# Patient Record
Sex: Female | Born: 1960 | Race: White | Hispanic: No | Marital: Married | Smoking: Never smoker
Health system: Southern US, Community
[De-identification: ages and names within clinical notes are randomized; demographics above are authoritative.]

## PROBLEM LIST (undated history)

## (undated) DIAGNOSIS — F419 Anxiety disorder, unspecified: Secondary | ICD-10-CM

## (undated) DIAGNOSIS — E669 Obesity, unspecified: Secondary | ICD-10-CM

## (undated) DIAGNOSIS — N390 Urinary tract infection, site not specified: Secondary | ICD-10-CM

## (undated) DIAGNOSIS — N950 Postmenopausal bleeding: Secondary | ICD-10-CM

## (undated) DIAGNOSIS — M25512 Pain in left shoulder: Secondary | ICD-10-CM

## (undated) DIAGNOSIS — R350 Frequency of micturition: Principal | ICD-10-CM

## (undated) DIAGNOSIS — E782 Mixed hyperlipidemia: Principal | ICD-10-CM

## (undated) DIAGNOSIS — Z79890 Hormone replacement therapy (postmenopausal): Secondary | ICD-10-CM

## (undated) DIAGNOSIS — Z87440 Personal history of urinary (tract) infections: Secondary | ICD-10-CM

## (undated) DIAGNOSIS — R319 Hematuria, unspecified: Secondary | ICD-10-CM

## (undated) DIAGNOSIS — R309 Painful micturition, unspecified: Secondary | ICD-10-CM

## (undated) HISTORY — DX: Urinary tract infection, site not specified: N39.0

## (undated) HISTORY — DX: Hormone replacement therapy: Z79.890

## (undated) HISTORY — DX: Frequency of micturition: R35.0

## (undated) HISTORY — PX: CHOLECYSTECTOMY: SHX55

## (undated) HISTORY — DX: Personal history of urinary (tract) infections: Z87.440

## (undated) HISTORY — DX: Obesity, unspecified: E66.9

## (undated) HISTORY — DX: Postmenopausal bleeding: N95.0

## (undated) HISTORY — DX: Hematuria, unspecified: R31.9

## (undated) HISTORY — DX: Mixed hyperlipidemia: E78.2

## (undated) HISTORY — PX: LAPAROSCOPIC CHOLECYSTECTOMY: SUR755

## (undated) HISTORY — DX: Painful micturition, unspecified: R30.9

## (undated) HISTORY — DX: Anxiety disorder, unspecified: F41.9

---

## 2000-06-28 ENCOUNTER — Other Ambulatory Visit: Admission: RE | Admit: 2000-06-28 | Discharge: 2000-06-28 | Payer: Self-pay | Admitting: Dermatology

## 2000-07-10 ENCOUNTER — Encounter: Payer: Self-pay | Admitting: Obstetrics and Gynecology

## 2000-07-10 ENCOUNTER — Ambulatory Visit (HOSPITAL_COMMUNITY): Admission: RE | Admit: 2000-07-10 | Discharge: 2000-07-10 | Payer: Self-pay | Admitting: Obstetrics and Gynecology

## 2001-04-30 ENCOUNTER — Other Ambulatory Visit: Admission: RE | Admit: 2001-04-30 | Discharge: 2001-04-30 | Payer: Self-pay | Admitting: Obstetrics and Gynecology

## 2001-07-31 ENCOUNTER — Encounter: Payer: Self-pay | Admitting: Obstetrics and Gynecology

## 2001-07-31 ENCOUNTER — Ambulatory Visit (HOSPITAL_COMMUNITY): Admission: RE | Admit: 2001-07-31 | Discharge: 2001-07-31 | Payer: Self-pay | Admitting: Obstetrics and Gynecology

## 2003-05-11 ENCOUNTER — Other Ambulatory Visit: Admission: RE | Admit: 2003-05-11 | Discharge: 2003-05-11 | Payer: Self-pay | Admitting: Obstetrics and Gynecology

## 2003-12-25 ENCOUNTER — Ambulatory Visit (HOSPITAL_COMMUNITY): Admission: RE | Admit: 2003-12-25 | Discharge: 2003-12-25 | Payer: Self-pay | Admitting: Obstetrics and Gynecology

## 2004-12-26 ENCOUNTER — Ambulatory Visit (HOSPITAL_COMMUNITY): Admission: RE | Admit: 2004-12-26 | Discharge: 2004-12-26 | Payer: Self-pay | Admitting: Obstetrics and Gynecology

## 2006-01-11 ENCOUNTER — Ambulatory Visit (HOSPITAL_COMMUNITY): Admission: RE | Admit: 2006-01-11 | Discharge: 2006-01-11 | Payer: Self-pay | Admitting: Obstetrics and Gynecology

## 2006-12-06 ENCOUNTER — Other Ambulatory Visit: Admission: RE | Admit: 2006-12-06 | Discharge: 2006-12-06 | Payer: Self-pay | Admitting: Obstetrics and Gynecology

## 2007-01-21 ENCOUNTER — Other Ambulatory Visit: Admission: RE | Admit: 2007-01-21 | Discharge: 2007-01-21 | Payer: Self-pay | Admitting: Obstetrics and Gynecology

## 2007-05-20 ENCOUNTER — Ambulatory Visit (HOSPITAL_COMMUNITY): Admission: RE | Admit: 2007-05-20 | Discharge: 2007-05-20 | Payer: Self-pay | Admitting: Obstetrics and Gynecology

## 2008-01-15 ENCOUNTER — Other Ambulatory Visit: Admission: RE | Admit: 2008-01-15 | Discharge: 2008-01-15 | Payer: Self-pay | Admitting: Obstetrics and Gynecology

## 2008-06-01 ENCOUNTER — Ambulatory Visit (HOSPITAL_COMMUNITY): Admission: RE | Admit: 2008-06-01 | Discharge: 2008-06-01 | Payer: Self-pay | Admitting: Obstetrics and Gynecology

## 2010-02-27 ENCOUNTER — Encounter: Payer: Self-pay | Admitting: Emergency Medicine

## 2010-02-27 ENCOUNTER — Encounter: Payer: Self-pay | Admitting: Obstetrics and Gynecology

## 2010-04-08 ENCOUNTER — Other Ambulatory Visit (HOSPITAL_COMMUNITY)
Admission: RE | Admit: 2010-04-08 | Discharge: 2010-04-08 | Disposition: A | Discharge status: Home / Self Care | Payer: 59 | Source: Ambulatory Visit | Attending: Obstetrics and Gynecology | Admitting: Obstetrics and Gynecology

## 2010-04-08 DIAGNOSIS — Z01419 Encounter for gynecological examination (general) (routine) without abnormal findings: Secondary | ICD-10-CM | POA: Insufficient documentation

## 2010-04-26 ENCOUNTER — Other Ambulatory Visit: Payer: Self-pay | Admitting: Adult Health

## 2010-06-07 ENCOUNTER — Ambulatory Visit (HOSPITAL_COMMUNITY)
Admission: RE | Admit: 2010-06-07 | Discharge: 2010-06-07 | Disposition: A | Discharge status: Home / Self Care | Payer: 59 | Source: Ambulatory Visit | Attending: Obstetrics & Gynecology | Admitting: Obstetrics & Gynecology

## 2010-06-07 ENCOUNTER — Ambulatory Visit (HOSPITAL_COMMUNITY): Payer: 59

## 2010-06-07 ENCOUNTER — Other Ambulatory Visit: Payer: Self-pay | Admitting: Obstetrics & Gynecology

## 2010-06-07 DIAGNOSIS — R102 Pelvic and perineal pain: Secondary | ICD-10-CM

## 2010-06-07 DIAGNOSIS — R109 Unspecified abdominal pain: Secondary | ICD-10-CM | POA: Insufficient documentation

## 2010-06-07 DIAGNOSIS — N390 Urinary tract infection, site not specified: Secondary | ICD-10-CM

## 2010-12-08 ENCOUNTER — Other Ambulatory Visit: Payer: Self-pay | Admitting: Adult Health

## 2010-12-08 DIAGNOSIS — Z139 Encounter for screening, unspecified: Secondary | ICD-10-CM

## 2010-12-19 ENCOUNTER — Ambulatory Visit (HOSPITAL_COMMUNITY)
Admission: RE | Admit: 2010-12-19 | Discharge: 2010-12-19 | Disposition: A | Discharge status: Home / Self Care | Payer: 59 | Source: Ambulatory Visit | Attending: Adult Health | Admitting: Adult Health

## 2010-12-19 DIAGNOSIS — Z1231 Encounter for screening mammogram for malignant neoplasm of breast: Secondary | ICD-10-CM | POA: Insufficient documentation

## 2010-12-19 DIAGNOSIS — Z139 Encounter for screening, unspecified: Secondary | ICD-10-CM

## 2010-12-30 ENCOUNTER — Other Ambulatory Visit: Payer: Self-pay | Admitting: Adult Health

## 2010-12-30 DIAGNOSIS — R928 Other abnormal and inconclusive findings on diagnostic imaging of breast: Secondary | ICD-10-CM

## 2011-01-02 ENCOUNTER — Other Ambulatory Visit: Payer: Self-pay | Admitting: Adult Health

## 2011-01-02 DIAGNOSIS — R928 Other abnormal and inconclusive findings on diagnostic imaging of breast: Secondary | ICD-10-CM

## 2011-01-12 ENCOUNTER — Ambulatory Visit
Admission: RE | Admit: 2011-01-12 | Discharge: 2011-01-12 | Disposition: A | Discharge status: Home / Self Care | Payer: 59 | Source: Ambulatory Visit | Attending: Adult Health | Admitting: Adult Health

## 2011-01-12 DIAGNOSIS — R928 Other abnormal and inconclusive findings on diagnostic imaging of breast: Secondary | ICD-10-CM

## 2011-01-18 ENCOUNTER — Encounter (HOSPITAL_COMMUNITY): Payer: 59

## 2011-10-17 ENCOUNTER — Other Ambulatory Visit: Payer: Self-pay | Admitting: Adult Health

## 2011-10-17 ENCOUNTER — Other Ambulatory Visit (HOSPITAL_COMMUNITY)
Admission: RE | Admit: 2011-10-17 | Discharge: 2011-10-17 | Disposition: A | Discharge status: Home / Self Care | Payer: 59 | Source: Ambulatory Visit | Attending: Obstetrics and Gynecology | Admitting: Obstetrics and Gynecology

## 2011-10-17 DIAGNOSIS — Z01419 Encounter for gynecological examination (general) (routine) without abnormal findings: Secondary | ICD-10-CM | POA: Insufficient documentation

## 2011-10-17 DIAGNOSIS — Z1151 Encounter for screening for human papillomavirus (HPV): Secondary | ICD-10-CM | POA: Insufficient documentation

## 2012-01-09 ENCOUNTER — Other Ambulatory Visit: Payer: Self-pay | Admitting: Adult Health

## 2012-01-09 DIAGNOSIS — Z09 Encounter for follow-up examination after completed treatment for conditions other than malignant neoplasm: Secondary | ICD-10-CM

## 2012-01-15 ENCOUNTER — Ambulatory Visit (HOSPITAL_COMMUNITY)
Admission: RE | Admit: 2012-01-15 | Discharge: 2012-01-15 | Disposition: A | Discharge status: Home / Self Care | Payer: 59 | Source: Ambulatory Visit | Attending: Adult Health | Admitting: Adult Health

## 2012-01-15 DIAGNOSIS — Z1231 Encounter for screening mammogram for malignant neoplasm of breast: Secondary | ICD-10-CM | POA: Insufficient documentation

## 2012-01-15 DIAGNOSIS — Z09 Encounter for follow-up examination after completed treatment for conditions other than malignant neoplasm: Secondary | ICD-10-CM

## 2012-07-09 ENCOUNTER — Other Ambulatory Visit: Payer: Self-pay | Admitting: Adult Health

## 2012-07-09 ENCOUNTER — Telehealth: Payer: Self-pay | Admitting: Adult Health

## 2012-07-09 MED ORDER — SULFAMETHOXAZOLE-TRIMETHOPRIM 800-160 MG PO TABS: 1.0000 | ORAL_TABLET | Freq: Two times a day (BID) | ORAL | Status: DC

## 2012-07-09 NOTE — Telephone Encounter (Signed)
Debbie called with frequency and urgency and wanted something called in for UTI, will call [facility] to [employer].

## 2012-08-21 ENCOUNTER — Ambulatory Visit: Payer: 59 | Admitting: Orthopedic Surgery

## 2012-08-26 ENCOUNTER — Ambulatory Visit (HOSPITAL_COMMUNITY)
Admission: RE | Admit: 2012-08-26 | Discharge: 2012-08-26 | Disposition: A | Discharge status: Home / Self Care | Payer: 59 | Source: Ambulatory Visit | Attending: Orthopedic Surgery | Admitting: Orthopedic Surgery

## 2012-08-26 ENCOUNTER — Other Ambulatory Visit: Payer: Self-pay | Admitting: Orthopedic Surgery

## 2012-08-26 DIAGNOSIS — M25512 Pain in left shoulder: Secondary | ICD-10-CM

## 2012-08-26 DIAGNOSIS — M25519 Pain in unspecified shoulder: Secondary | ICD-10-CM | POA: Insufficient documentation

## 2012-08-27 ENCOUNTER — Ambulatory Visit (INDEPENDENT_AMBULATORY_CARE_PROVIDER_SITE_OTHER): Payer: 59 | Admitting: Orthopedic Surgery

## 2012-08-27 ENCOUNTER — Encounter: Payer: Self-pay | Admitting: Orthopedic Surgery

## 2012-08-27 VITALS — BP 142/96 | Ht 66.5 in | Wt 247.0 lb

## 2012-08-27 DIAGNOSIS — M75102 Unspecified rotator cuff tear or rupture of left shoulder, not specified as traumatic: Secondary | ICD-10-CM

## 2012-08-27 DIAGNOSIS — M67919 Unspecified disorder of synovium and tendon, unspecified shoulder: Secondary | ICD-10-CM

## 2012-08-27 MED ORDER — TRAMADOL-ACETAMINOPHEN 37.5-325 MG PO TABS: 1.0000 | ORAL_TABLET | ORAL | Status: DC | PRN

## 2012-08-27 NOTE — Progress Notes (Signed)
  Subjective:    Patient ID: Amanda Higgins, female    DOB: [birth year], 52 y.o.   MRN: [id number]  HPI Comments: I cant sleep and i can't reach behind me   Shoulder Pain  The pain is present in the left shoulder. This is a new problem. The current episode started more than 1 month ago (3-6 mos). The problem has been unchanged. The quality of the pain is described as pounding and aching. Associated symptoms include tingling. Associated symptoms comments: Catching .      Review of Systems  Musculoskeletal:       Joint pain and swelling   Neurological: Positive for tingling.  All other systems reviewed and are negative.       Objective:   Physical Exam BP 142/96  Ht 5' 6.5" (1.689 m)  Wt 247 lb (112.038 kg)  BMI 39.27 kg/m2 General appearance is normal, the patient is alert and oriented x3 with normal mood and affect. Gait normal   Right shoulder normal ROM, stability and strength    Left shoulder:  Posterior joint line tender periacromial tenderness, Loss of IR and ER (hip and 30 respectively) FE pain S spinatus 5/5  abd ER normal  Impingement signs:  Painful forward elevation at 120 degrees  Painful arc of motion passively 90-150 degrees  Neer sign +   Neurovascular exam normal and neck non tender   [facility] X rays were normal  (I rev report and film )      Assessment & Plan:  Left shoulder RCS   Subacromial Shoulder Injection Procedure Note  Pre-operative Diagnosis: left RC Syndrome  Post-operative Diagnosis: same  Indications: pain   Anesthesia: ethyl chloride   Procedure Details   Verbal consent was obtained for the procedure. The shoulder was prepped withalcohol and the skin was anesthetized. A 20 gauge needle was advanced into the subacromial space through posterior approach without difficulty  The space was then injected with 3 ml 1% lidocaine and 1 ml of depomedrol. The injection site was cleansed with isopropyl alcohol and a dressing was  applied.  Complications:  None; patient tolerated the procedure well.  Start PT  [employer]

## 2012-08-27 NOTE — Patient Instructions (Signed)
You have received a steroid shot. 15% of patients experience increased pain at the injection site with in the next 24 hours. This is best treated with ice and tylenol extra strength 2 tabs every 8 hours. If you are still having pain please call the office.   Impingement Syndrome, Rotator Cuff, Bursitis with Rehab Impingement syndrome is a condition that involves inflammation of the tendons of the rotator cuff and the subacromial bursa, that causes pain in the shoulder. The rotator cuff consists of four tendons and muscles that control much of the shoulder and upper arm function. The subacromial bursa is a fluid filled sac that helps reduce friction between the rotator cuff and one of the bones of the shoulder (acromion). Impingement syndrome is usually an overuse injury that causes swelling of the bursa (bursitis), swelling of the tendon (tendonitis), and/or a tear of the tendon (strain). Strains are classified into three categories. Grade 1 strains cause pain, but the tendon is not lengthened. Grade 2 strains include a lengthened ligament, due to the ligament being stretched or partially ruptured. With grade 2 strains there is still function, although the function may be decreased. Grade 3 strains include a complete tear of the tendon or muscle, and function is usually impaired. SYMPTOMS    Pain around the shoulder, often at the outer portion of the upper arm.   Pain that gets worse with shoulder function, especially when reaching overhead or lifting.   Sometimes, aching when not using the arm.   Pain that wakes you up at night.   Sometimes, tenderness, swelling, warmth, or redness over the affected area.   Loss of strength.   Limited motion of the shoulder, especially reaching behind the back (to the back pocket or to unhook bra) or across your body.   Crackling sound (crepitation) when moving the arm.   Biceps tendon pain and inflammation (in the front of the shoulder). Worse when bending  the elbow or lifting.  CAUSES   Impingement syndrome is often an overuse injury, in which chronic (repetitive) motions cause the tendons or bursa to become inflamed. A strain occurs when a force is paced on the tendon or muscle that is greater than it can withstand. Common mechanisms of injury include: Stress from sudden increase in duration, frequency, or intensity of training.  Direct hit (trauma) to the shoulder.   Aging, erosion of the tendon with normal use.   Bony bump on shoulder (acromial spur).  RISK INCREASES WITH:  Contact sports (football, wrestling, boxing).   Throwing sports (baseball, tennis, volleyball).   Weightlifting and bodybuilding.   Heavy labor.   Previous injury to the rotator cuff, including impingement.   Poor shoulder strength and flexibility.   Failure to warm up properly before activity.   Inadequate protective equipment.   Old age.   Bony bump on shoulder (acromial spur).  PREVENTION    Warm up and stretch properly before activity.   Allow for adequate recovery between workouts.   Maintain physical fitness:   Strength, flexibility, and endurance.   Cardiovascular fitness.   Learn and use proper exercise technique.  PROGNOSIS   If treated properly, impingement syndrome usually goes away within 6 weeks. Sometimes surgery is required.   RELATED COMPLICATIONS    Longer healing time if not properly treated, or if not given enough time to heal.   Recurring symptoms, that result in a chronic condition.   Shoulder stiffness, frozen shoulder, or loss of motion.   Rotator cuff tendon tear.     Recurring symptoms, especially if activity is resumed too soon, with overuse, with a direct blow, or when using poor technique.  TREATMENT   Treatment first involves the use of ice and medicine, to reduce pain and inflammation. The use of strengthening and stretching exercises may help reduce pain with activity. These exercises may be performed at home  or with a therapist. If non-surgical treatment is unsuccessful after more than 6 months, surgery may be advised. After surgery and rehabilitation, activity is usually possible in 3 months.   MEDICATION  If pain medicine is needed, nonsteroidal anti-inflammatory medicines (aspirin and ibuprofen), or other minor pain relievers (acetaminophen), are often advised.   Do not take pain medicine for 7 days before surgery.   Prescription pain relievers may be given, if your caregiver thinks they are needed. Use only as directed and only as much as you need.   Corticosteroid injections may be given by your caregiver. These injections should be reserved for the most serious cases, because they may only be given a certain number of times.  HEAT AND COLD  Cold treatment (icing) should be applied for 10 to 15 minutes every 2 to 3 hours for inflammation and pain, and immediately after activity that aggravates your symptoms. Use ice packs or an ice massage.   Heat treatment may be used before performing stretching and strengthening activities prescribed by your caregiver, physical therapist, or athletic trainer. Use a heat pack or a warm water soak.  SEEK MEDICAL CARE IF:    Symptoms get worse or do not improve in 4 to 6 weeks, despite treatment.   New, unexplained symptoms develop. (Drugs used in treatment may produce side effects.)   

## 2012-08-30 ENCOUNTER — Ambulatory Visit (HOSPITAL_COMMUNITY)
Admission: RE | Admit: 2012-08-30 | Discharge: 2012-08-30 | Disposition: A | Discharge status: Home / Self Care | Payer: 59 | Source: Ambulatory Visit | Attending: Orthopedic Surgery | Admitting: Orthopedic Surgery

## 2012-08-30 DIAGNOSIS — M25619 Stiffness of unspecified shoulder, not elsewhere classified: Secondary | ICD-10-CM | POA: Insufficient documentation

## 2012-08-30 DIAGNOSIS — M6289 Other specified disorders of muscle: Secondary | ICD-10-CM | POA: Insufficient documentation

## 2012-08-30 DIAGNOSIS — IMO0001 Reserved for inherently not codable concepts without codable children: Secondary | ICD-10-CM | POA: Insufficient documentation

## 2012-08-30 DIAGNOSIS — M75102 Unspecified rotator cuff tear or rupture of left shoulder, not specified as traumatic: Secondary | ICD-10-CM

## 2012-08-30 DIAGNOSIS — M6281 Muscle weakness (generalized): Secondary | ICD-10-CM | POA: Insufficient documentation

## 2012-08-30 DIAGNOSIS — M25519 Pain in unspecified shoulder: Secondary | ICD-10-CM | POA: Insufficient documentation

## 2012-08-30 NOTE — Evaluation (Addendum)
Occupational Therapy Evaluation  Patient Details  Name: Amanda Higgins MRN: [id number] Date of Birth: [birth year]  Today's Date: 08/30/2012 Time: 1120-1205 OT Time Calculation (min): 45 min OT Evaluation 1120-1135 15' Manual Therapy 5784-6962 20' Heat 9528-4132 10' Visit#: 1 of 12  Re-eval: 09/27/12  Assessment Diagnosis: Rotator Cuff Syndrome Left Shoulder Next MD Visit: August - with Dr. Romeo Apple Prior Therapy: n/a  Past Medical History:  Past Medical History  Diagnosis Date  . GERD (gastroesophageal reflux disease)    Past Surgical History:  Past Surgical History  Procedure Laterality Date  . Laparoscopic cholecystectomy      Subjective S:  My shoulder has been hurting for months, I finally went to the MD this week.  Pertinent History: Amanda Higgins has been experiencing pain in her left shoulder for several months, with gradual decline in mobility and increase in pain.  She consulted with Dr. Romeo Apple on 08/27/12, received a cortisone injection, and has been referred to occupational therapy for evaluation and treatment.  Special Tests: DASH scored 52 Patient Stated Goals: "I want to get the movement back and be pain free." Pain Assessment Currently in Pain?: Yes Pain Score: 4  Pain Location: Shoulder Pain Orientation: Left Pain Type: Acute pain  Precautions/Restrictions  Precautions Precautions: None Restrictions Weight Bearing Restrictions: No  Balance Screening Balance Screen Has the patient fallen in the past 6 months: No Has the patient had a decrease in activity level because of a fear of falling? : No Is the patient reluctant to leave their home because of a fear of falling? : No  Prior Functioning  Prior Function Driving: Yes Vocation: Full time employment Vocation Requirements: RN - administration Comments: enjoys traveling and reading  Assessment ADL/Vision/Perception ADL ADL Comments: Reaching behind her back or out to her left and back is  quite painful.  She has increased pain in her left shoulder with prolonged computer use.  Dominant Hand: Right  Sensation/Coordination/Edema Sensation Light Touch: Appears Intact Coordination Gross Motor Movements are Fluid and Coordinated: Yes Fine Motor Movements are Fluid and Coordinated: Yes  Additional Assessments LUE AROM (degrees) LUE Overall AROM Comments: assessed in seated, ER/IR with shoulder abducted Left Shoulder Flexion: 135 Degrees Left Shoulder ABduction: 127 Degrees Left Shoulder Internal Rotation: 33 Degrees Left Shoulder External Rotation: 95 Degrees LUE PROM (degrees) LUE Overall PROM Comments: assessed in supine, WFL` LUE Strength LUE Overall Strength Comments: assessed in seated Left Shoulder Flexion: 4/5 Left Shoulder ABduction: 4/5 Left Shoulder Internal Rotation: 4/5 Left Shoulder External Rotation: 4/5 Palpation Palpation: mod-max fascial restrictions in her upper arm, and moderate fascial restrictions in her left scapular region.      Exercise/Treatments    Modalities Modalities: Moist Heat Manual Therapy Manual Therapy: Myofascial release Myofascial Release: MFR and manual stretching to upper arm, shoulder, scapular, trapezius region of left shoulder to  decrease pain and fascial restrictions and increase pain free mobility.  Moist Heat Therapy Number Minutes Moist Heat: 10 Minutes Moist Heat Location: Shoulder  Occupational Therapy Assessment and Plan OT Assessment and Plan Clinical Impression Statement: A:  Patient presents with increased pain and fasical restrictions and decreased mobility and strength in her left shoulder region, which is causing decreased participation with B/IADLs, work, and leisure activities.  Pt will benefit from skilled therapeutic intervention in order to improve on the following deficits: Decreased strength;Decreased range of motion;Increased fascial restricitons;Increased muscle spasms;Pain Rehab Potential:  Excellent OT Frequency: Min 2X/week OT Duration: 6 weeks OT Treatment/Interventions: Self-care/ADL training;Therapeutic exercise;Manual therapy;Modalities;Patient/family education OT  Plan: P:  Skilled OT intervention to increase AROM and strength and decrease pain and fascial restrictions in left shoulder region in order to return to prior level of independence with all B/IADLs, work, and leisure activities.  Treatment Plan:  MFR and manual stretching to left shoulder region, AAROM progressing to AROM as tolerated.  Scapular stability exercises.    Goals Short Term Goals Time to Complete Short Term Goals: 3 weeks Short Term Goal 1: Patient will be educated on a HEP. Short Term Goal 2: Patient will increase AROM to [facility] in her left shoulder to improve ability to reach behind back to fasten her bra. Short Term Goal 3: Patient with increase left shoulder strength to 4+/5 for increased ability to lift binders at work.  Short Term Goal 4: Patient will decrease pain in her left shoulder to 3/10 when sitting at her desk. Short Term Goal 5: Patient will decrease fascial restrictions to minimal in her left shoulder region.  Long Term Goals Time to Complete Long Term Goals: 6 weeks Long Term Goal 1: Patient will return to prior level of independence with all B/IADLs, work, and leisure actiivties.  Long Term Goal 2: Patient will increase AROM to WNL in her left shoulder to improve ability to reach behind back to fasten her bra. Long Term Goal 3: Patient with increase left shoulder strength to 5/5 for increased ability to lift bags of groceries. Long Term Goal 4: Patient will decrease pain in her left shoulder to 1/10 when sitting at her desk. Long Term Goal 5: Patient will decrease fascial restrictions to  trace her left shoulder region.   Problem List Patient Active Problem List   Diagnosis Date Noted  . Pain in joint, shoulder region 08/30/2012  . Muscle tightness 08/30/2012  . Rotator cuff syndrome  of left shoulder 08/27/2012    End of Session Activity Tolerance: Patient tolerated treatment well General Behavior During Therapy: [facility] for tasks assessed/performed OT Plan of Care OT Home Exercise Plan: educated patient on shoulder stretches, use of heat for pain relief, and red tband for scapular stability.  Consulted and Agree with Plan of Care: Patient  GO    Shirlean Mylar, OTR/L  08/30/2012, 2:18 PM  Physician Documentation Your signature is required to indicate approval of the treatment plan as stated above.  Please sign and either send electronically or make a copy of this report for your files and return this physician signed original.  Please mark one 1.__approve of plan  2. ___approve of plan with the following conditions.   ______________________________                                                          _____________________ Physician Signature  Date  

## 2012-09-09 ENCOUNTER — Ambulatory Visit (HOSPITAL_COMMUNITY)
Admission: RE | Admit: 2012-09-09 | Discharge: 2012-09-09 | Disposition: A | Discharge status: Home / Self Care | Payer: 59 | Source: Ambulatory Visit | Attending: Orthopedic Surgery | Admitting: Orthopedic Surgery

## 2012-09-09 DIAGNOSIS — IMO0001 Reserved for inherently not codable concepts without codable children: Secondary | ICD-10-CM | POA: Insufficient documentation

## 2012-09-09 DIAGNOSIS — M6281 Muscle weakness (generalized): Secondary | ICD-10-CM | POA: Insufficient documentation

## 2012-09-09 DIAGNOSIS — M6289 Other specified disorders of muscle: Secondary | ICD-10-CM

## 2012-09-09 DIAGNOSIS — M25512 Pain in left shoulder: Secondary | ICD-10-CM

## 2012-09-09 DIAGNOSIS — M25619 Stiffness of unspecified shoulder, not elsewhere classified: Secondary | ICD-10-CM | POA: Insufficient documentation

## 2012-09-09 DIAGNOSIS — M25519 Pain in unspecified shoulder: Secondary | ICD-10-CM | POA: Insufficient documentation

## 2012-09-09 NOTE — Progress Notes (Signed)
Occupational Therapy Treatment Patient Details  Name: Amanda Higgins MRN: [id number] Date of Birth: January 27, 1961  Today's Date: 09/09/2012 Time: 9147-8295 OT Time Calculation (min): 36 min MFR 6213-086578' Therex 4696-2952 24'  Visit#: 2 of 12  Re-eval: 09/27/12    Authorization: n/a  Authorization Time Period:    Authorization Visit#:   of    Subjective Symptoms/Limitations Symptoms: S: I'm having a hard time doing the band exercise where I keep my arm close to me. I can't do it very well.  Pain Assessment Currently in Pain?: Yes Pain Score: 4  Pain Location: Shoulder Pain Orientation: Left Pain Type: Acute pain  Precautions/Restrictions  Precautions Precautions: None  Exercise/Treatments Supine Protraction: PROM;10 reps Horizontal ABduction: PROM;10 reps External Rotation: PROM;10 reps Internal Rotation: PROM;10 reps Flexion: PROM;10 reps ABduction: PROM;10 reps Seated Elevation: AROM;10 reps Extension: AROM;10 reps Row: AROM;10 reps Therapy Ball Flexion: 15 reps ABduction: 15 reps ROM / Strengthening / Isometric Strengthening Thumb Tacks: 1' Prot/Ret//Elev/Dep: 1'       Manual Therapy Manual Therapy: Myofascial release Myofascial Release: MFR and manual stretching to upper arm, shoulder, scapular, trapezius region of left shoulder to decrease pain and fascial restrictions and increase pain free mobility.   Occupational Therapy Assessment and Plan OT Assessment and Plan Clinical Impression Statement: A: Reviewed HEP. Increased pain during manual stretching. Patient unable to tolerate full PROM supine. Deferred introducing AAROM supine this date due to pain. Will attempt next visit.  Pt will benefit from skilled therapeutic intervention in order to improve on the following deficits: Decreased strength;Decreased range of motion;Increased fascial restricitons;Increased muscle spasms;Pain Rehab Potential: Excellent OT Frequency: Min 2X/week OT Duration: 6  weeks OT Treatment/Interventions: Self-care/ADL training;Therapeutic exercise;Manual therapy;Modalities;Patient/family education OT Plan: P: Attempt AAROM supine.    Goals Short Term Goals Time to Complete Short Term Goals: 3 weeks Short Term Goal 1: Patient will be educated on a HEP. Short Term Goal 1 Progress: Progressing toward goal Short Term Goal 2: Patient will increase AROM to [facility] in her left shoulder to improve ability to reach behind back to fasten her bra. Short Term Goal 2 Progress: Progressing toward goal Short Term Goal 3: Patient with increase left shoulder strength to 4+/5 for increased ability to lift binders at work.  Short Term Goal 3 Progress: Progressing toward goal Short Term Goal 4: Patient will decrease pain in her left shoulder to 3/10 when sitting at her desk. Short Term Goal 4 Progress: Progressing toward goal Short Term Goal 5: Patient will decrease fascial restrictions to minimal in her left shoulder region.  Short Term Goal 5 Progress: Progressing toward goal Long Term Goals Time to Complete Long Term Goals: 6 weeks Long Term Goal 1: Patient will return to prior level of independence with all B/IADLs, work, and leisure actiivties.  Long Term Goal 1 Progress: Progressing toward goal Long Term Goal 2: Patient will increase AROM to WNL in her left shoulder to improve ability to reach behind back to fasten her bra. Long Term Goal 2 Progress: Progressing toward goal Long Term Goal 3: Patient with increase left shoulder strength to 5/5 for increased ability to lift bags of groceries. Long Term Goal 3 Progress: Progressing toward goal Long Term Goal 4: Patient will decrease pain in her left shoulder to 1/10 when sitting at her desk. Long Term Goal 4 Progress: Progressing toward goal Long Term Goal 5: Patient will decrease fascial restrictions to  trace her left shoulder region.  Long Term Goal 5 Progress: Progressing toward goal  Problem List Patient Active  Problem List   Diagnosis Date Noted  . Pain in joint, shoulder region 08/30/2012  . Muscle tightness 08/30/2012  . Rotator cuff syndrome of left shoulder 08/27/2012    End of Session Activity Tolerance: Patient tolerated treatment well General Behavior During Therapy: [facility] for tasks assessed/performed   Limmie Patricia, OTR/L,CBIS   09/09/2012, 11:54 AM

## 2012-09-24 ENCOUNTER — Inpatient Hospital Stay (HOSPITAL_COMMUNITY): Admission: RE | Admit: 2012-09-24 | Payer: 59 | Source: Ambulatory Visit

## 2012-10-01 ENCOUNTER — Ambulatory Visit (HOSPITAL_COMMUNITY)
Admission: RE | Admit: 2012-10-01 | Discharge: 2012-10-01 | Disposition: A | Discharge status: Home / Self Care | Payer: 59 | Source: Ambulatory Visit | Attending: Adult Health | Admitting: Adult Health

## 2012-10-01 NOTE — Progress Notes (Signed)
Occupational Therapy Treatment Patient Details  Name: Amanda Higgins MRN: [id number] Date of Birth: [birth year]  Today's Date: 10/01/2012 Time: 1350-1430 OT Time Calculation (min): 40 min MFR 1350-1409 19' Therex 4098-1191 21  Visit#: 3 of 12  Re-eval: 09/27/12    Authorization: n/a  Authorization Time Period:    Authorization Visit#:   of    Subjective Symptoms/Limitations Symptoms: S: I've been trying to stretch my arm out a lot at home.   Precautions/Restrictions  Precautions Precautions: None  Exercise/Treatments Supine Protraction: PROM;AAROM;10 reps Horizontal ABduction: PROM;AAROM;10 reps External Rotation: PROM;AAROM;10 reps Internal Rotation: PROM;AAROM;10 reps Flexion: PROM;AAROM;10 reps ABduction: PROM;AAROM;10 reps Seated Elevation: AROM;15 reps Extension: AROM;15 reps Row: PROM;15 reps ROM / Strengthening / Isometric Strengthening Proximal Shoulder Strengthening, Supine: 10X        Manual Therapy Manual Therapy: Myofascial release Myofascial Release: MFR and manual stretching to upper arm, shoulder, scapular, trapezius region of left shoulder to decrease pain and fascial restrictions and increase pain free mobility.  Occupational Therapy Assessment and Plan OT Assessment and Plan Clinical Impression Statement: A: Added AAROM supine. Patient tolerated well with no pain.  OT Plan: P: Add AAROM seated.   Goals Short Term Goals Time to Complete Short Term Goals: 3 weeks Short Term Goal 1: Patient will be educated on a HEP. Short Term Goal 1 Progress: Progressing toward goal Short Term Goal 2: Patient will increase AROM to [facility] in her left shoulder to improve ability to reach behind back to fasten her bra. Short Term Goal 2 Progress: Progressing toward goal Short Term Goal 3: Patient with increase left shoulder strength to 4+/5 for increased ability to lift binders at work.  Short Term Goal 3 Progress: Progressing toward goal Short Term Goal 4:  Patient will decrease pain in her left shoulder to 3/10 when sitting at her desk. Short Term Goal 4 Progress: Progressing toward goal Short Term Goal 5: Patient will decrease fascial restrictions to minimal in her left shoulder region.  Short Term Goal 5 Progress: Progressing toward goal Long Term Goals Time to Complete Long Term Goals: 6 weeks Long Term Goal 1: Patient will return to prior level of independence with all B/IADLs, work, and leisure actiivties.  Long Term Goal 1 Progress: Progressing toward goal Long Term Goal 2: Patient will increase AROM to WNL in her left shoulder to improve ability to reach behind back to fasten her bra. Long Term Goal 2 Progress: Progressing toward goal Long Term Goal 3: Patient with increase left shoulder strength to 5/5 for increased ability to lift bags of groceries. Long Term Goal 3 Progress: Progressing toward goal Long Term Goal 4: Patient will decrease pain in her left shoulder to 1/10 when sitting at her desk. Long Term Goal 4 Progress: Progressing toward goal Long Term Goal 5: Patient will decrease fascial restrictions to  trace her left shoulder region.  Long Term Goal 5 Progress: Progressing toward goal  Problem List Patient Active Problem List   Diagnosis Date Noted  . Pain in joint, shoulder region 08/30/2012  . Muscle tightness 08/30/2012  . Rotator cuff syndrome of left shoulder 08/27/2012    End of Session Activity Tolerance: Patient tolerated treatment well General Behavior During Therapy: [facility] for tasks assessed/performed   Limmie Patricia, OTR/L,CBIS   10/01/2012, 3:54 PM

## 2012-10-02 ENCOUNTER — Inpatient Hospital Stay (HOSPITAL_COMMUNITY): Admission: RE | Admit: 2012-10-02 | Payer: 59 | Source: Ambulatory Visit

## 2012-10-08 ENCOUNTER — Telehealth: Payer: Self-pay

## 2012-10-08 NOTE — Telephone Encounter (Signed)
Per RMR, call and triage pt for first screening colonoscopy. Called, [facility] for a return call.

## 2012-10-09 NOTE — Telephone Encounter (Signed)
Pt returned call at end of day yesterday and said she forgot to mention to RMR that she does have reflux although she takes Prilosec daily, and she thinks she might need EGD at the same time. I sent Dr. Jena Gauss a staff message and he said ok to triage and add EGD.

## 2012-10-09 NOTE — Telephone Encounter (Signed)
LMOM earlier today for pt to return call that Dr Jena Gauss said ok to triage and schedule both.

## 2012-10-10 ENCOUNTER — Other Ambulatory Visit: Payer: Self-pay

## 2012-10-10 DIAGNOSIS — K219 Gastro-esophageal reflux disease without esophagitis: Secondary | ICD-10-CM

## 2012-10-10 DIAGNOSIS — Z1211 Encounter for screening for malignant neoplasm of colon: Secondary | ICD-10-CM

## 2012-10-10 MED ORDER — PEG-KCL-NACL-NASULF-NA ASC-C 100 G PO SOLR: 1.0000 | ORAL | Status: DC

## 2012-10-10 NOTE — Telephone Encounter (Signed)
Gastroenterology Pre-Procedure Review  Request Date: 10/09/2012 Requesting Physician: Dr. Jena Gauss  For Screening colonoscopy and EGD for reflux  PATIENT REVIEW QUESTIONS: The patient responded to the following health history questions as indicated:    1. Diabetes Melitis: no 2. Joint replacements in the past 12 months: no 3. Major health problems in the past 3 months: no 4. Has an artificial valve or MVP: no 5. Has a defibrillator: no 6. Has been advised in past to take antibiotics in advance of a procedure like teeth cleaning: no    MEDICATIONS & ALLERGIES:    Patient reports the following regarding taking any blood thinners:   Plavix? no Aspirin? no Coumadin? no  Patient confirms/reports the following medications:  Current Outpatient Prescriptions  Medication Sig Dispense Refill  . ibuprofen (ADVIL,MOTRIN) 200 MG tablet Take 200 mg by mouth every 6 (six) hours as needed for pain. As needed      . NON FORMULARY Vitamin C      . NON FORMULARY Combi patch    As directed      . omeprazole (PRILOSEC) 20 MG capsule Take 20 mg by mouth daily.      . traMADol-acetaminophen (ULTRACET) 37.5-325 MG per tablet Take 1-2 tablets by mouth every 4 (four) hours as needed for pain.  60 tablet  2   No current facility-administered medications for this visit.    Patient confirms/reports the following allergies:  No Known Allergies  No orders of the defined types were placed in this encounter.    AUTHORIZATION INFORMATION Primary Insurance:   ID #:   Group #:  Pre-Cert / Auth required:  Pre-Cert / Auth #:   Secondary Insurance:   ID #:   Group #:  Pre-Cert / Auth required:  Pre-Cert / Auth #:   SCHEDULE INFORMATION: Procedure has been scheduled as follows:  Date: 10/18/2012    Time: 9:45 AM Location: [facility] Short Stay  This Gastroenterology Pre-Precedure Review Form is being routed to the following provider(s): R. Roetta Sessions, MD

## 2012-10-10 NOTE — Telephone Encounter (Signed)
Okay to schedule for EGD and colonoscopy

## 2012-10-10 NOTE — Telephone Encounter (Signed)
Rx sent to the pharmacy and instructions mailed to pt.  

## 2012-10-17 ENCOUNTER — Encounter (HOSPITAL_COMMUNITY): Payer: Self-pay | Admitting: Pharmacy Technician

## 2012-10-18 ENCOUNTER — Encounter (HOSPITAL_COMMUNITY): Payer: Self-pay | Admitting: *Deleted

## 2012-10-18 ENCOUNTER — Ambulatory Visit (HOSPITAL_COMMUNITY)
Admission: RE | Admit: 2012-10-18 | Discharge: 2012-10-18 | Disposition: A | Discharge status: Home / Self Care | Payer: 59 | Source: Ambulatory Visit | Attending: Internal Medicine | Admitting: Internal Medicine

## 2012-10-18 ENCOUNTER — Encounter (HOSPITAL_COMMUNITY)
Admission: RE | Disposition: A | Discharge status: Home / Self Care | Payer: Self-pay | Source: Ambulatory Visit | Attending: Internal Medicine

## 2012-10-18 DIAGNOSIS — D131 Benign neoplasm of stomach: Secondary | ICD-10-CM

## 2012-10-18 DIAGNOSIS — K5730 Diverticulosis of large intestine without perforation or abscess without bleeding: Secondary | ICD-10-CM

## 2012-10-18 DIAGNOSIS — K210 Gastro-esophageal reflux disease with esophagitis: Secondary | ICD-10-CM

## 2012-10-18 DIAGNOSIS — K219 Gastro-esophageal reflux disease without esophagitis: Secondary | ICD-10-CM | POA: Insufficient documentation

## 2012-10-18 DIAGNOSIS — Z1211 Encounter for screening for malignant neoplasm of colon: Secondary | ICD-10-CM | POA: Insufficient documentation

## 2012-10-18 HISTORY — DX: Pain in left shoulder: M25.512

## 2012-10-18 HISTORY — PX: COLONOSCOPY WITH ESOPHAGOGASTRODUODENOSCOPY (EGD): SHX5779

## 2012-10-18 SURGERY — COLONOSCOPY WITH ESOPHAGOGASTRODUODENOSCOPY (EGD)
Anesthesia: Moderate Sedation

## 2012-10-18 MED ORDER — ONDANSETRON HCL 4 MG/2ML IJ SOLN
INTRAMUSCULAR | Status: DC | PRN
  Administered 2012-10-18: 4 mg via INTRAVENOUS

## 2012-10-18 MED ORDER — MEPERIDINE HCL 100 MG/ML IJ SOLN
INTRAMUSCULAR | Status: AC
  Filled 2012-10-18: qty 1

## 2012-10-18 MED ORDER — MIDAZOLAM HCL 5 MG/5ML IJ SOLN
INTRAMUSCULAR | Status: AC
  Filled 2012-10-18: qty 10

## 2012-10-18 MED ORDER — SODIUM CHLORIDE 0.9 % IV SOLN
INTRAVENOUS | Status: DC
  Administered 2012-10-18: 09:00:00 via INTRAVENOUS

## 2012-10-18 MED ORDER — MEPERIDINE HCL 100 MG/ML IJ SOLN
INTRAMUSCULAR | Status: DC | PRN
  Administered 2012-10-18: 50 mg via INTRAVENOUS
  Administered 2012-10-18 (×2): 25 mg via INTRAVENOUS

## 2012-10-18 MED ORDER — ONDANSETRON HCL 4 MG/2ML IJ SOLN
INTRAMUSCULAR | Status: AC
  Filled 2012-10-18: qty 2

## 2012-10-18 MED ORDER — MIDAZOLAM HCL 5 MG/5ML IJ SOLN
INTRAMUSCULAR | Status: DC | PRN
  Administered 2012-10-18: 2 mg via INTRAVENOUS
  Administered 2012-10-18 (×2): 1 mg via INTRAVENOUS
  Administered 2012-10-18 (×2): 2 mg via INTRAVENOUS
  Administered 2012-10-18: 1 mg via INTRAVENOUS

## 2012-10-18 MED ORDER — STERILE WATER FOR IRRIGATION IR SOLN
Status: DC | PRN
  Administered 2012-10-18: 10:00:00

## 2012-10-18 NOTE — Op Note (Signed)
[facility] [address] [place], [address]   COLONOSCOPY PROCEDURE REPORT  PATIENT: Amanda Higgins, Amanda Higgins  MR#:         [id number] BIRTHDATE: 1960/04/14 , 52  yrs. old GENDER: Female ENDOSCOPIST: R.  Roetta Sessions, MD FACP [facility] REFERRED BY:  Cyril Mourning PROCEDURE DATE:  10/18/2012 PROCEDURE:     Ileocolonoscopy-screening  INDICATIONS: First-ever average risk screening examination  INFORMED CONSENT:  The risks, benefits, alternatives and imponderables including but not limited to bleeding, perforation as well as the possibility of a missed lesion have been reviewed.  The potential for biopsy, lesion removal, etc. have also been discussed.  Questions have been answered.  All parties agreeable. Please see the history and physical in the medical record for more information.  MEDICATIONS: Versed 9 mg IV and Demerol 100 mg IV in divided doses. Zofran 4 mg IV  DESCRIPTION OF PROCEDURE:  After a digital rectal exam was performed, the EC-3490TLi ([id number]) and EC-3890Li ([id number]) colonoscope was advanced from the anus through the rectum and colon to the area of the cecum, ileocecal valve and appendiceal orifice. The cecum was deeply intubated.  These structures were well-seen and photographed for the record.  From the level of the cecum and ileocecal valve, the scope was slowly and cautiously withdrawn. The mucosal surfaces were carefully surveyed utilizing scope tip deflection to facilitate fold flattening as needed.  The scope was pulled down into the rectum where a thorough examination including retroflexion was performed.    FINDINGS:  Adequate preparation. Normal rectum. Scattered sigmoid and descending diverticula; the remainder of the colonic mucosa appeared normal. The distal 5 cm of terminal ileal mucosa also appeared normal.  THERAPEUTIC / DIAGNOSTIC MANEUVERS PERFORMED:  None  COMPLICATIONS: None  CECAL WITHDRAWAL TIME:  11 minutes  IMPRESSION:   Colonic diverticulosis  RECOMMENDATIONS: Repeat colonoscopy in 10 years for screening purposes.   _______________________________ eSigned:  R. Roetta Sessions, MD FACP [facility] 10/18/2012 10:48 AM   CC:

## 2012-10-18 NOTE — H&P (Signed)
Primary Care Physician:  GRIFFIN,JENNIFER, NP Primary Gastroenterologist:  Dr. Jena Gauss  Pre-Procedure History & Physical: HPI:  Amanda Higgins is a 52 y.o. female here for her first ever screening colonoscopy. No lower GI tract symptoms. No family history of polyps or colon cancer. Patient also complains long-standing reflux symptoms. Takes OTC omeprazole daily.   Intermittent nocturnal reflux/aspiration symptoms which awakens her from a sound sleep. Suboptimal eating habits. Sometimes eats later in the evening. No dysphagia, melena, early satiety or weight loss. No tobacco or alcohol. Distant EGD demonstrated no Barrett's esophagus.  Past Medical History  Diagnosis Date  . GERD (gastroesophageal reflux disease)   . Left shoulder pain     Nerve impingement    Past Surgical History  Procedure Laterality Date  . Laparoscopic cholecystectomy      Prior to Admission medications   Medication Sig Start Date End Date Taking? Authorizing Provider  Estradiol-Norethindrone Acet [facility] TD) Place 1 patch onto the skin 2 (two) times a week.   Yes Historical Provider, MD  ibuprofen (ADVIL,MOTRIN) 200 MG tablet Take 200 mg by mouth every 6 (six) hours as needed for pain. As needed   Yes Historical Provider, MD  NON FORMULARY Combi patch    As directed   Yes Historical Provider, MD  omeprazole (PRILOSEC) 20 MG capsule Take 20 mg by mouth daily.   Yes Historical Provider, MD  vitamin C (ASCORBIC ACID) 500 MG tablet Take 500 mg by mouth daily.   Yes Historical Provider, MD    Allergies as of 10/10/2012  . (No Known Allergies)    Family History  Problem Relation Age of Onset  . Colon cancer Neg Hx     History   Social History  . Marital Status: Married    Spouse Name: N/A    Number of Children: N/A  . Years of Education: N/A   Occupational History  . Not on file.   Social History Main Topics  . Smoking status: Never Smoker   . Smokeless tobacco: Not on file  . Alcohol Use: Yes    Comment: occasional wine  . Drug Use: No  . Sexual Activity: Not on file   Other Topics Concern  . Not on file   Social History Narrative  . No narrative on file    Review of Systems: See HPI, otherwise negative ROS  Physical Exam: BP 153/91  Pulse 94  Temp(Src) 98 F (36.7 C) (Oral)  Resp 17  Ht 5\' 6"  (1.676 m)  Wt 230 lb (104.327 kg)  BMI 37.14 kg/m2  SpO2 98% General:   Alert,  Well-developed, well-nourished, pleasant and cooperative in NAD Skin:  Intact without significant lesions or rashes. Eyes:  Sclera clear, no icterus.   Conjunctiva pink. Ears:  Normal auditory acuity. Nose:  No deformity, discharge,  or lesions. Mouth:  No deformity or lesions. Neck:  Supple; no masses or thyromegaly. No significant cervical adenopathy. Lungs:  Clear throughout to auscultation.   No wheezes, crackles, or rhonchi. No acute distress. Heart:  Regular rate and rhythm; no murmurs, clicks, rubs,  or gallops. Abdomen: Non-distended, normal bowel sounds.  Soft and nontender without appreciable mass or hepatosplenomegaly.  Pulses:  Normal pulses noted. Extremities:  Without clubbing or edema.  Impression:  Pleasant 52 year old lady here for her first ever average risk screening colonoscopy. In addition, patient has frequent breakthrough reflux symptoms-long-standing- in spite of taking omeprazole 20 mg daily. No alarm features. She desires her upper GI tract to be evaluated at  the same time as a screening colonoscopy. This is not at all unreasonable and, in fact, indicated.  Recommendations:   EGD and screening colonoscopy today.The risks, benefits, limitations, imponderables and alternatives regarding both EGD and colonoscopy have been reviewed with the patient. Questions have been answered. All parties agreeable.

## 2012-10-18 NOTE — Op Note (Signed)
[facility] [address] [place], [address]   ENDOSCOPY PROCEDURE REPORT  PATIENT: Amanda Higgins, Amanda Higgins  MR#: [id number] BIRTHDATE: Jun 06, 1960 , 52  yrs. old GENDER: Female ENDOSCOPIST: R.  Roetta Sessions, MD FACP [facility] REFERRED BY:  Cyril Mourning PROCEDURE DATE:  10/18/2012 PROCEDURE:     EGD with gastric biopsy  INDICATIONS:     long-standing GERD; breakthrough symptoms with once daily omeprazole  INFORMED CONSENT:   The risks, benefits, limitations, alternatives and imponderables have been discussed.  The potential for biopsy, esophogeal dilation, etc. have also been reviewed.  Questions have been answered.  All parties agreeable.  Please see the history and physical in the medical record for more information.  MEDICATIONS:  Versed 5 mg IV and Demerol 75 mg IV in divided doses. Cetacaine spray. Zofran 4 mg IV  DESCRIPTION OF PROCEDURE:   The EG-[id number] ([id number])  endoscope was introduced through the mouth and advanced to the second portion of the duodenum without difficulty or limitations.  The mucosal surfaces were surveyed very carefully during advancement of the scope and upon withdrawal.  Retroflexion view of the proximal stomach and esophagogastric junction was performed.      FINDINGS:  Distal esophageal erosions within 5 mm of the GE junction. No Barrett's esophagus. Stomach empty. 2 cm hiatal hernia. Scattered 1-3 mm benign appearing fundal gland-type polyps. No ulcer or infiltrating process or other abnormality. Patent pylorus. Normal-appearing first and second portion of the duodenum.  THERAPEUTIC / DIAGNOSTIC MANEUVERS PERFORMED:  gastric polyps biopsied.   COMPLICATIONS:  None  IMPRESSION:   Mild erosive reflux esophagitis. 2 cm hiatal hernia. Gastric polyps-biopsied  RECOMMENDATIONS:  Stop omeprazole; begin Dexilant 60 mg daily. Patient is to go by my office for free samples.  GERD information provided. Patient is urged reduce BMI.  Followup on pathology. See colonoscopy report.    _______________________________ R. Roetta Sessions, MD FACP [facility] eSigned:  R. Roetta Sessions, MD FACP [facility] 10/18/2012 10:45 AM     CC:  PATIENT NAME:  Amanda Higgins, Amanda Higgins MR#: [id number]

## 2012-10-23 ENCOUNTER — Encounter: Payer: Self-pay | Admitting: Internal Medicine

## 2012-10-23 ENCOUNTER — Encounter (HOSPITAL_COMMUNITY): Payer: Self-pay | Admitting: Internal Medicine

## 2012-10-29 ENCOUNTER — Ambulatory Visit: Payer: 59 | Admitting: Orthopedic Surgery

## 2012-10-29 ENCOUNTER — Encounter: Payer: Self-pay | Admitting: Orthopedic Surgery

## 2012-11-07 ENCOUNTER — Other Ambulatory Visit: Payer: Self-pay | Admitting: Adult Health

## 2012-11-07 MED ORDER — SULFAMETHOXAZOLE-TMP DS 800-160 MG PO TABS: 1.0000 | ORAL_TABLET | Freq: Two times a day (BID) | ORAL | Status: DC

## 2012-11-21 ENCOUNTER — Other Ambulatory Visit: Payer: Self-pay | Admitting: Adult Health

## 2012-11-22 ENCOUNTER — Telehealth: Payer: Self-pay | Admitting: Obstetrics & Gynecology

## 2012-11-22 ENCOUNTER — Telehealth: Payer: Self-pay | Admitting: *Deleted

## 2012-11-22 MED ORDER — ESTRADIOL-NORETHINDRONE ACET 0.05-0.25 MG/DAY TD PTTW: 1.0000 | MEDICATED_PATCH | TRANSDERMAL | Status: DC

## 2012-11-22 NOTE — Telephone Encounter (Signed)
[facility] pharmacy sent refill for combipatch to Cyril Mourning, NP, states pt cannot wait until Monday when Victorino Dike will be back in office.   Requesting refill for combipatch.

## 2012-11-22 NOTE — Telephone Encounter (Signed)
combipatch order clarified to pharmacist at [facility].

## 2012-12-03 ENCOUNTER — Telehealth: Payer: Self-pay

## 2012-12-03 ENCOUNTER — Telehealth: Payer: Self-pay | Admitting: Internal Medicine

## 2012-12-03 MED ORDER — DEXLANSOPRAZOLE 60 MG PO CPDR: 60.0000 mg | DELAYED_RELEASE_CAPSULE | Freq: Every day | ORAL | Status: DC

## 2012-12-03 NOTE — Telephone Encounter (Signed)
Thanks

## 2012-12-03 NOTE — Telephone Encounter (Signed)
Patient's husband called me and left a message on my cell phone. He reports a foul up with the pharmacy associated with the insurance company. Can't get Dexilant through  [employer]. He requests a prescription be called in to [facility]. Please call in a prescription for Dexilant 60 mg capsules-one daily. #90 with 3 refills. Once called in, please call patient's husband, Jaliza Seifried, at [phone] and let him know it's been called in.

## 2012-12-03 NOTE — Telephone Encounter (Signed)
Marchelle Folks at [facility] called and said the Dexilant would require a PA, could we give pt samples while that is being worked on. I told her yes, and # 10 Dexilant 60 mg at front for pt to pick up.

## 2012-12-03 NOTE — Addendum Note (Signed)
Addended by: Myra Rude on: 12/03/2012 02:42 PM   Modules accepted: Orders

## 2012-12-03 NOTE — Telephone Encounter (Signed)
Sent rx to pharmacy. pts husband is aware.

## 2012-12-11 ENCOUNTER — Other Ambulatory Visit: Payer: Self-pay | Admitting: Adult Health

## 2012-12-11 ENCOUNTER — Ambulatory Visit (INDEPENDENT_AMBULATORY_CARE_PROVIDER_SITE_OTHER): Payer: 59 | Admitting: Adult Health

## 2012-12-11 ENCOUNTER — Encounter: Payer: Self-pay | Admitting: Adult Health

## 2012-12-11 VITALS — BP 120/78 | Ht 66.0 in | Wt 258.0 lb

## 2012-12-11 DIAGNOSIS — R309 Painful micturition, unspecified: Secondary | ICD-10-CM | POA: Insufficient documentation

## 2012-12-11 DIAGNOSIS — R300 Dysuria: Secondary | ICD-10-CM

## 2012-12-11 DIAGNOSIS — Z139 Encounter for screening, unspecified: Secondary | ICD-10-CM

## 2012-12-11 DIAGNOSIS — R319 Hematuria, unspecified: Secondary | ICD-10-CM | POA: Insufficient documentation

## 2012-12-11 HISTORY — DX: Hematuria, unspecified: R31.9

## 2012-12-11 HISTORY — DX: Painful micturition, unspecified: R30.9

## 2012-12-11 LAB — POCT URINALYSIS DIPSTICK
Blood, UA: 2
Nitrite, UA: NEGATIVE

## 2012-12-11 MED ORDER — CIPROFLOXACIN HCL 500 MG PO TABS: 500.0000 mg | ORAL_TABLET | Freq: Two times a day (BID) | ORAL | Status: DC

## 2012-12-11 NOTE — Progress Notes (Signed)
Subjective:     Patient ID: Amanda Higgins, female   DOB: [birth year], 52 y.o.   MRN: [id number]  HPI Amanda Higgins is a 52 year old white female in complaining of urinary pain, and frequency that stared this am and has history of recent UTI.  Review of Systems See HPI Reviewed past medical,surgical, social and family history. Reviewed medications and allergies.      Objective:   Physical Exam BP 120/78  Ht 5\' 6"  (1.676 m)  Wt 258 lb (117.028 kg)  BMI 41.66 kg/m2urine blue she took Uribel, has slight odor   urine 2+leuks and 2+blood, no CVAT  Assessment:     Urinary pain Hematuria    Plan:     Urine sent for UA C&S Rx cipro 500 mg 1 bid x 7 days Push fluids Review handout on UTI

## 2012-12-11 NOTE — Patient Instructions (Signed)
Urinary Tract Infection Urinary tract infections (UTIs) can develop anywhere along your urinary tract. Your urinary tract is your body's drainage system for removing wastes and extra water. Your urinary tract includes two kidneys, two ureters, a bladder, and a urethra. Your kidneys are a pair of bean-shaped organs. Each kidney is about the size of your fist. They are located below your ribs, one on each side of your spine. CAUSES Infections are caused by microbes, which are microscopic organisms, including fungi, viruses, and bacteria. These organisms are so small that they can only be seen through a microscope. Bacteria are the microbes that most commonly cause UTIs. SYMPTOMS  Symptoms of UTIs may vary by age and gender of the patient and by the location of the infection. Symptoms in young women typically include a frequent and intense urge to urinate and a painful, burning feeling in the bladder or urethra during urination. Older women and men are more likely to be tired, shaky, and weak and have muscle aches and abdominal pain. A fever may mean the infection is in your kidneys. Other symptoms of a kidney infection include pain in your back or sides below the ribs, nausea, and vomiting. DIAGNOSIS To diagnose a UTI, your caregiver will ask you about your symptoms. Your caregiver also will ask to provide a urine sample. The urine sample will be tested for bacteria and white blood cells. White blood cells are made by your body to help fight infection. TREATMENT  Typically, UTIs can be treated with medication. Because most UTIs are caused by a bacterial infection, they usually can be treated with the use of antibiotics. The choice of antibiotic and length of treatment depend on your symptoms and the type of bacteria causing your infection. HOME CARE INSTRUCTIONS  If you were prescribed antibiotics, take them exactly as your caregiver instructs you. Finish the medication even if you feel better after you  have only taken some of the medication.  Drink enough water and fluids to keep your urine clear or pale yellow.  Avoid caffeine, tea, and carbonated beverages. They tend to irritate your bladder.  Empty your bladder often. Avoid holding urine for long periods of time.  Empty your bladder before and after sexual intercourse.  After a bowel movement, women should cleanse from front to back. Use each tissue only once. SEEK MEDICAL CARE IF:   You have back pain.  You develop a fever.  Your symptoms do not begin to resolve within 3 days. SEEK IMMEDIATE MEDICAL CARE IF:   You have severe back pain or lower abdominal pain.  You develop chills.  You have nausea or vomiting.  You have continued burning or discomfort with urination. MAKE SURE YOU:   Understand these instructions.  Will watch your condition.  Will get help right away if you are not doing well or get worse. Document Released: 11/02/2004 Document Revised: 07/25/2011 Document Reviewed: 03/03/2011 [facility] Patient Information 2014 [place], [place]. Take cipro push fluids

## 2012-12-12 ENCOUNTER — Other Ambulatory Visit: Payer: Self-pay

## 2012-12-12 LAB — URINALYSIS
Ketones, ur: NEGATIVE mg/dL
Nitrite: NEGATIVE
Specific Gravity, Urine: 1.007 (ref 1.005–1.030)
pH: 6 (ref 5.0–8.0)

## 2012-12-13 ENCOUNTER — Telehealth: Payer: Self-pay | Admitting: Adult Health

## 2012-12-13 LAB — URINE CULTURE

## 2012-12-13 NOTE — Telephone Encounter (Signed)
Left message that urine had e coli and that the cipro should take care of it

## 2012-12-17 ENCOUNTER — Ambulatory Visit (INDEPENDENT_AMBULATORY_CARE_PROVIDER_SITE_OTHER): Payer: 59 | Admitting: Adult Health

## 2012-12-17 ENCOUNTER — Encounter: Payer: Self-pay | Admitting: Adult Health

## 2012-12-17 VITALS — BP 120/82 | HR 78 | Ht 66.0 in | Wt 253.0 lb

## 2012-12-17 DIAGNOSIS — Z79890 Hormone replacement therapy (postmenopausal): Secondary | ICD-10-CM

## 2012-12-17 DIAGNOSIS — Z01419 Encounter for gynecological examination (general) (routine) without abnormal findings: Secondary | ICD-10-CM

## 2012-12-17 HISTORY — DX: Hormone replacement therapy: Z79.890

## 2012-12-17 MED ORDER — FLUCONAZOLE 150 MG PO TABS: ORAL_TABLET | ORAL | Status: DC

## 2012-12-17 NOTE — Patient Instructions (Signed)
Physical in 1 year Mammogram yearly  Get labs  Colonoscopy per GI

## 2012-12-17 NOTE — Progress Notes (Signed)
Patient ID: Amanda Higgins, female   DOB: 26-Jun-1960, 52 y.o.   MRN: [id number] History of Present Illness: Amanda Higgins is a 52 year old white female, married in for a physical.She had a normal pap with negative HPV 10/2011.She has recently had a UTI and is on cipro.She is on HRT and happy.Got flu shot at work.   Current Medications, Allergies, Past Medical History, Past Surgical History, Family History and Social History were reviewed in [employer] record.     Review of Systems: Patient denies any headaches, blurred vision, shortness of breath, chest pain, abdominal pain, problems with bowel movements, or intercourse. No joint swelling or mood changes, has had recent UTI and some SUI.    Physical Exam:BP 120/82  Pulse 78  Ht 5\' 6"  (1.676 m)  Wt 253 lb (114.76 kg)  BMI 40.85 kg/m2 General:  Well developed, well nourished, no acute distress Skin:  Warm and dry Neck:  Midline trachea, normal thyroid Lungs; Clear to auscultation bilaterally Breast:  No dominant palpable mass, retraction, or nipple discharge Cardiovascular: Regular rate and rhythm Abdomen:  Soft, non tender, no hepatosplenomegaly Pelvic:  External genitalia is normal in appearance.  The vagina is normal in appearance, some white discharge, no odor. The cervix is bulbous.  Uterus is felt to be normal size, shape, and contour.  No                adnexal masses or tenderness noted. Rectal:deferred just had colonoscopy,  Extremities:  No swelling noted, has spider veins Psych:  No mood changes, alert and cooperative seems happy   Impression: Yearly gyn exam no pap or hemoccult HRT Recent UTI   Plan: Physical in 1 year Mammogram yearly Colonoscopy per GI Get fasting labs in near future, CBC,CMP,TSH and lipids Rx diflucan 150 mg #2 1 now and 1 in 3 days with 1 refill  Consider shingles vaccine

## 2012-12-20 ENCOUNTER — Other Ambulatory Visit: Payer: Self-pay | Admitting: Adult Health

## 2012-12-20 LAB — COMPREHENSIVE METABOLIC PANEL
ALT: 18 U/L (ref 0–35)
Albumin: 3.9 g/dL (ref 3.5–5.2)
CO2: 27 mEq/L (ref 19–32)
Calcium: 9.3 mg/dL (ref 8.4–10.5)
Chloride: 105 mEq/L (ref 96–112)
Glucose, Bld: 101 mg/dL — ABNORMAL HIGH (ref 70–99)
Potassium: 4.3 mEq/L (ref 3.5–5.3)
Sodium: 140 mEq/L (ref 135–145)
Total Bilirubin: 0.5 mg/dL (ref 0.3–1.2)
Total Protein: 6.9 g/dL (ref 6.0–8.3)

## 2012-12-20 LAB — CBC
Hemoglobin: 13.5 g/dL (ref 12.0–15.0)
MCH: 29.9 pg (ref 26.0–34.0)
RBC: 4.51 MIL/uL (ref 3.87–5.11)
WBC: 7.4 10*3/uL (ref 4.0–10.5)

## 2012-12-20 LAB — LIPID PANEL
Cholesterol: 228 mg/dL — ABNORMAL HIGH (ref 0–200)
Triglycerides: 93 mg/dL (ref <150)

## 2012-12-23 ENCOUNTER — Telehealth: Payer: Self-pay | Admitting: Adult Health

## 2012-12-23 MED ORDER — ATORVASTATIN CALCIUM 10 MG PO TABS: 10.0000 mg | ORAL_TABLET | Freq: Every day | ORAL | Status: DC

## 2012-12-23 NOTE — Addendum Note (Signed)
Addended by: Cyril Mourning A on: 12/23/2012 02:00 PM   Modules accepted: Orders

## 2012-12-23 NOTE — Telephone Encounter (Signed)
Left message that TC 228 LDL 165 and ratio 5.2 will rx Lipitor 10 mg and check labs in 8 weeks

## 2013-01-16 ENCOUNTER — Ambulatory Visit (HOSPITAL_COMMUNITY): Payer: 59

## 2013-01-22 ENCOUNTER — Telehealth: Payer: Self-pay | Admitting: Adult Health

## 2013-01-22 MED ORDER — AZITHROMYCIN 250 MG PO TABS: ORAL_TABLET | ORAL | Status: DC

## 2013-01-22 NOTE — Telephone Encounter (Signed)
Called complains of sinus/URI rx Zpack

## 2013-06-10 ENCOUNTER — Telehealth: Payer: Self-pay | Admitting: Adult Health

## 2013-06-10 MED ORDER — AZITHROMYCIN 250 MG PO TABS: ORAL_TABLET | ORAL | Status: DC

## 2013-06-10 NOTE — Telephone Encounter (Signed)
Has URI with fever will rx zpak

## 2013-07-04 ENCOUNTER — Telehealth: Payer: Self-pay | Admitting: Adult Health

## 2013-07-04 MED ORDER — CIPROFLOXACIN HCL 500 MG PO TABS: 500.0000 mg | ORAL_TABLET | Freq: Two times a day (BID) | ORAL | Status: DC

## 2013-07-04 MED ORDER — PREDNISONE 10 MG PO TABS: ORAL_TABLET | ORAL | Status: DC

## 2013-07-04 NOTE — Telephone Encounter (Signed)
Amanda Higgins is calling complains of cough and congestion had Zpack about 4 weeks ago will rx cipro and prednisone and follow up next week

## 2013-07-11 ENCOUNTER — Ambulatory Visit (INDEPENDENT_AMBULATORY_CARE_PROVIDER_SITE_OTHER): Payer: 59 | Admitting: Adult Health

## 2013-07-11 ENCOUNTER — Encounter: Payer: Self-pay | Admitting: Adult Health

## 2013-07-11 VITALS — BP 116/78 | Ht 66.0 in | Wt 249.0 lb

## 2013-07-11 DIAGNOSIS — R350 Frequency of micturition: Secondary | ICD-10-CM

## 2013-07-11 DIAGNOSIS — R319 Hematuria, unspecified: Secondary | ICD-10-CM | POA: Insufficient documentation

## 2013-07-11 DIAGNOSIS — R300 Dysuria: Secondary | ICD-10-CM

## 2013-07-11 HISTORY — DX: Frequency of micturition: R35.0

## 2013-07-11 LAB — POCT URINALYSIS DIPSTICK
GLUCOSE UA: NEGATIVE
Ketones, UA: NEGATIVE
Nitrite, UA: NEGATIVE
Protein, UA: NEGATIVE

## 2013-07-11 MED ORDER — NITROFURANTOIN MONOHYD MACRO 100 MG PO CAPS
100.0000 mg | ORAL_CAPSULE | Freq: Two times a day (BID) | ORAL | Status: DC
Start: 2013-07-11 — End: 2013-07-23

## 2013-07-11 NOTE — Patient Instructions (Signed)
Urinary Tract Infection Urinary tract infections (UTIs) can develop anywhere along your urinary tract. Your urinary tract is your body's drainage system for removing wastes and extra water. Your urinary tract includes two kidneys, two ureters, a bladder, and a urethra. Your kidneys are a pair of bean-shaped organs. Each kidney is about the size of your fist. They are located below your ribs, one on each side of your spine. CAUSES Infections are caused by microbes, which are microscopic organisms, including fungi, viruses, and bacteria. These organisms are so small that they can only be seen through a microscope. Bacteria are the microbes that most commonly cause UTIs. SYMPTOMS  Symptoms of UTIs may vary by age and gender of the patient and by the location of the infection. Symptoms in young women typically include a frequent and intense urge to urinate and a painful, burning feeling in the bladder or urethra during urination. Older women and men are more likely to be tired, shaky, and weak and have muscle aches and abdominal pain. A fever may mean the infection is in your kidneys. Other symptoms of a kidney infection include pain in your back or sides below the ribs, nausea, and vomiting. DIAGNOSIS To diagnose a UTI, your caregiver will ask you about your symptoms. Your caregiver also will ask to provide a urine sample. The urine sample will be tested for bacteria and white blood cells. White blood cells are made by your body to help fight infection. TREATMENT  Typically, UTIs can be treated with medication. Because most UTIs are caused by a bacterial infection, they usually can be treated with the use of antibiotics. The choice of antibiotic and length of treatment depend on your symptoms and the type of bacteria causing your infection. HOME CARE INSTRUCTIONS  If you were prescribed antibiotics, take them exactly as your caregiver instructs you. Finish the medication even if you feel better after you  have only taken some of the medication.  Drink enough water and fluids to keep your urine clear or pale yellow.  Avoid caffeine, tea, and carbonated beverages. They tend to irritate your bladder.  Empty your bladder often. Avoid holding urine for long periods of time.  Empty your bladder before and after sexual intercourse.  After a bowel movement, women should cleanse from front to back. Use each tissue only once. SEEK MEDICAL CARE IF:   You have back pain.  You develop a fever.  Your symptoms do not begin to resolve within 3 days. SEEK IMMEDIATE MEDICAL CARE IF:   You have severe back pain or lower abdominal pain.  You develop chills.  You have nausea or vomiting.  You have continued burning or discomfort with urination. MAKE SURE YOU:   Understand these instructions.  Will watch your condition.  Will get help right away if you are not doing well or get worse. Document Released: 11/02/2004 Document Revised: 07/25/2011 Document Reviewed: 03/03/2011 [facility] Patient Information 2014 [place]. Increase water  Take macrobid

## 2013-07-11 NOTE — Progress Notes (Signed)
Subjective:     Patient ID: Amanda Higgins, female   DOB: 09/22/1960, 53 y.o.   MRN: [id number]  HPI Amanda Higgins is a 53 year old white female, married in complaining of urinary frequency and burning, taking AZO, has been on Cipro and prednisone for bronchitis,still has cough.  Review of Systems See HPI Reviewed past medical,surgical, social and family history. Reviewed medications and allergies.     Objective:   Physical Exam BP 116/78  Ht 5\' 6"  (1.676 m)  Wt 249 lb (112.946 kg)  BMI 40.21 kg/m2   trace blood and 1+ leuks, Skin warm and dry. Lungs: clear to ausculation bilaterally. Cardiovascular: regular rate and rhythm.No CVAT.  Assessment:    Urinary frequency  Urinary burning  Hematuria     Plan:     UA C&S sent Rx macrobid 1 bid x 7 days Push fluids til urine lite in color Follow up prn   review handout on UTI

## 2013-07-12 LAB — URINALYSIS
Bilirubin Urine: NEGATIVE
Glucose, UA: NEGATIVE mg/dL
Hgb urine dipstick: NEGATIVE
Ketones, ur: NEGATIVE mg/dL
Nitrite: POSITIVE — AB
Protein, ur: NEGATIVE mg/dL
Specific Gravity, Urine: 1.008 (ref 1.005–1.030)
Urobilinogen, UA: 0.2 mg/dL (ref 0.0–1.0)
pH: 6 (ref 5.0–8.0)

## 2013-07-13 LAB — URINE CULTURE

## 2013-07-14 ENCOUNTER — Telehealth: Payer: Self-pay | Admitting: Adult Health

## 2013-07-14 NOTE — Telephone Encounter (Signed)
Left message about urine that macrobid should take care of it

## 2013-07-23 ENCOUNTER — Telehealth: Payer: Self-pay | Admitting: Adult Health

## 2013-07-23 MED ORDER — NITROFURANTOIN MONOHYD MACRO 100 MG PO CAPS: 100.0000 mg | ORAL_CAPSULE | Freq: Two times a day (BID) | ORAL | Status: DC

## 2013-07-23 NOTE — Telephone Encounter (Signed)
[place] will rx macrobid

## 2013-10-16 ENCOUNTER — Ambulatory Visit (INDEPENDENT_AMBULATORY_CARE_PROVIDER_SITE_OTHER): Payer: 59 | Admitting: Adult Health

## 2013-10-16 ENCOUNTER — Encounter: Payer: Self-pay | Admitting: Adult Health

## 2013-10-16 VITALS — BP 136/88 | HR 78 | Ht 66.0 in | Wt 248.5 lb

## 2013-10-16 DIAGNOSIS — E669 Obesity, unspecified: Secondary | ICD-10-CM | POA: Insufficient documentation

## 2013-10-16 MED ORDER — LORCASERIN HCL 10 MG PO TABS: ORAL_TABLET | ORAL | Status: DC

## 2013-10-16 NOTE — Progress Notes (Signed)
Subjective:     Patient ID: Amanda Higgins, female   DOB: February 07, 1961, 53 y.o.   MRN: [id number]  HPI Rukia is a 53 year old white female in to discuss weight loss, she has researched and wants to try Belviq.Daughter getting married in November.  Review of Systems See HPI Reviewed past medical,surgical, social and family history. Reviewed medications and allergies.     Objective:   Physical Exam BP 136/88  Pulse 78  Ht 5\' 6"  (1.676 m)  Wt 248 lb 8 oz (112.719 kg)  BMI 40.13 kg/m2   Skin warm and dry.  Lungs: clear to ausculation bilaterally. Cardiovascular: regular rate and rhythm.  Assessment:     Obesity     Plan:     Rx belviq 10 mg take 1 bid #60 no refills Follow up in 4 weeks for weight and BP check Review handout on weight loss

## 2013-10-16 NOTE — Patient Instructions (Signed)

## 2013-11-13 ENCOUNTER — Encounter: Payer: Self-pay | Admitting: Adult Health

## 2013-11-13 ENCOUNTER — Ambulatory Visit (INDEPENDENT_AMBULATORY_CARE_PROVIDER_SITE_OTHER): Payer: 59 | Admitting: Adult Health

## 2013-11-13 VITALS — BP 128/90 | HR 76 | Ht 66.0 in | Wt 245.0 lb

## 2013-11-13 DIAGNOSIS — E669 Obesity, unspecified: Secondary | ICD-10-CM

## 2013-11-13 MED ORDER — LORCASERIN HCL 10 MG PO TABS: ORAL_TABLET | ORAL | Status: DC

## 2013-11-13 NOTE — Progress Notes (Signed)
Subjective:     Patient ID: Amanda Higgins, female   DOB: Jan 06, 1961, 53 y.o.   MRN: [id number]  HPI Amanda Higgins is a 54 year old white female in for weight and BP check.  Review of Systems See HPI Reviewed past medical,surgical, social and family history. Reviewed medications and allergies.     Objective:   Physical Exam BP 128/90  Pulse 76  Ht 5\' 6"  (1.676 m)  Wt 245 lb (111.131 kg)  BMI 39.56 kg/m2   Skin warm and dry. Lungs: clear to ausculation bilaterally. Cardiovascular: regular rate and rhythm.Lost 3.5 lbs,only taking once daily forgets pm dose  Assessment:     Obesity     Plan:     Refilled belviq #60 1 bid no refills Return in 4 weeks Review handout on weight loss and exercise

## 2013-11-13 NOTE — Patient Instructions (Signed)
Exercise to Lose Weight Exercise and a healthy diet may help you lose weight. Your doctor may suggest specific exercises. EXERCISE IDEAS AND TIPS  Choose low-cost things you enjoy doing, such as walking, bicycling, or exercising to workout videos.  Take stairs instead of the elevator.  Walk during your lunch break.  Park your car further away from work or school.  Go to a gym or an exercise class.  Start with 5 to 10 minutes of exercise each day. Build up to 30 minutes of exercise 4 to 6 days a week.  Wear shoes with good support and comfortable clothes.  Stretch before and after working out.  Work out until you breathe harder and your heart beats faster.  Drink extra water when you exercise.  Do not do so much that you hurt yourself, feel dizzy, or get very short of breath. Exercises that burn about 150 calories:  Running 1  miles in 15 minutes.  Playing volleyball for 45 to 60 minutes.  Washing and waxing a car for 45 to 60 minutes.  Playing touch football for 45 minutes.  Walking 1  miles in 35 minutes.  Pushing a stroller 1  miles in 30 minutes.  Playing basketball for 30 minutes.  Raking leaves for 30 minutes.  Bicycling 5 miles in 30 minutes.  Walking 2 miles in 30 minutes.  Dancing for 30 minutes.  Shoveling snow for 15 minutes.  Swimming laps for 20 minutes.  Walking up stairs for 15 minutes.  Bicycling 4 miles in 15 minutes.  Gardening for 30 to 45 minutes.  Jumping rope for 15 minutes.  Washing windows or floors for 45 to 60 minutes. Document Released: 02/25/2010 Document Revised: 04/17/2011 Document Reviewed: 02/25/2010 [facility] Patient Information 2015 [place], [place]. This information is not intended to replace advice given to you by your health care provider. Make sure you discuss any questions you have with your health care provider. Follow up in 4 weeks

## 2013-12-01 ENCOUNTER — Other Ambulatory Visit: Payer: Self-pay | Admitting: *Deleted

## 2013-12-02 MED ORDER — ESTRADIOL-NORETHINDRONE ACET 0.05-0.14 MG/DAY TD PTTW: 1.0000 | MEDICATED_PATCH | TRANSDERMAL | Status: DC

## 2013-12-08 ENCOUNTER — Encounter: Payer: Self-pay | Admitting: Adult Health

## 2013-12-10 ENCOUNTER — Other Ambulatory Visit: Payer: Self-pay | Admitting: Adult Health

## 2013-12-10 MED ORDER — ESTRADIOL-NORETHINDRONE ACET 0.05-0.14 MG/DAY TD PTTW: 1.0000 | MEDICATED_PATCH | TRANSDERMAL | Status: DC

## 2013-12-17 ENCOUNTER — Ambulatory Visit (INDEPENDENT_AMBULATORY_CARE_PROVIDER_SITE_OTHER): Payer: 59 | Admitting: Adult Health

## 2013-12-17 ENCOUNTER — Encounter: Payer: Self-pay | Admitting: Adult Health

## 2013-12-17 VITALS — BP 130/94 | Ht 66.0 in | Wt 241.5 lb

## 2013-12-17 DIAGNOSIS — E669 Obesity, unspecified: Secondary | ICD-10-CM

## 2013-12-17 DIAGNOSIS — Z6838 Body mass index (BMI) 38.0-38.9, adult: Secondary | ICD-10-CM

## 2013-12-17 DIAGNOSIS — Z713 Dietary counseling and surveillance: Secondary | ICD-10-CM | POA: Diagnosis not present

## 2013-12-17 MED ORDER — LORCASERIN HCL 10 MG PO TABS: ORAL_TABLET | ORAL | Status: DC

## 2013-12-17 NOTE — Progress Notes (Signed)
Subjective:     Patient ID: Amanda Higgins, female   DOB: [birth year], 53 y.o.   MRN: [id number]  HPI Amanda Higgins is a 53 year old white female in for weight check, on Belviq,daughter just got married.  Review of Systems See HPI Reviewed past medical,surgical, social and family history. Reviewed medications and allergies.     Objective:   Physical Exam BP 130/94 mmHg  Ht 5\' 6"  (1.676 m)  Wt 241 lb 8 oz (109.544 kg)  BMI 39.00 kg/m2 Skin warm and dry. Lungs: clear to ausculation bilaterally. Cardiovascular: regular rate and rhythm.   Has lost about 3.5 more lbs. Wants to continue for now.Since November of last year las lost about 16 lbs.Has been on Belviq 2 months.  Assessment:     Obesity     Plan:     Rx Belviq 10 mg take 1 bid #60 no refills Follow up in 4 weks

## 2013-12-17 NOTE — Patient Instructions (Signed)
Continue weight loss Follow up in 4 weeks 

## 2014-01-14 ENCOUNTER — Ambulatory Visit (INDEPENDENT_AMBULATORY_CARE_PROVIDER_SITE_OTHER): Payer: 59 | Admitting: Adult Health

## 2014-01-14 ENCOUNTER — Encounter: Payer: Self-pay | Admitting: Adult Health

## 2014-01-14 VITALS — BP 122/80 | Ht 66.0 in | Wt 241.5 lb

## 2014-01-14 DIAGNOSIS — E669 Obesity, unspecified: Secondary | ICD-10-CM

## 2014-01-14 MED ORDER — LORCASERIN HCL 10 MG PO TABS: ORAL_TABLET | ORAL | Status: DC

## 2014-01-14 NOTE — Progress Notes (Signed)
Subjective:     Patient ID: Amanda Higgins, female   DOB: 12/10/1960, 53 y.o.   MRN: [id number]  HPI [name] is a 53 year old white female in for weight and BP check, has lost 7 lbs with belviq, does not always take bid.No complaints.  Review of Systems See HPI Reviewed past medical,surgical, social and family history. Reviewed medications and allergies.     Objective:   Physical Exam BP 122/80 mmHg  Ht 5\' 6"  (1.676 m)  Wt 241 lb 8 oz (109.544 kg)  BMI 39.00 kg/m2   Skin warm and dry. Lungs: clear to ausculation bilaterally. Cardiovascular: regular rate and rhythm.   Assessment:     Obesity     Plan:     Refilled belviq 10 mg #60 1 bid no refills Follow up in 4 weeks for weight and BP check Continue weight loss efforts

## 2014-01-14 NOTE — Patient Instructions (Signed)
Follow up in 4 weeks Continue weight loss efforts  

## 2014-01-21 ENCOUNTER — Other Ambulatory Visit: Payer: Self-pay | Admitting: Adult Health

## 2014-01-21 DIAGNOSIS — Z1231 Encounter for screening mammogram for malignant neoplasm of breast: Secondary | ICD-10-CM

## 2014-01-22 ENCOUNTER — Ambulatory Visit (HOSPITAL_COMMUNITY)
Admission: RE | Admit: 2014-01-22 | Discharge: 2014-01-22 | Disposition: A | Discharge status: Home / Self Care | Payer: 59 | Source: Ambulatory Visit | Attending: Adult Health | Admitting: Adult Health

## 2014-01-22 DIAGNOSIS — Z1231 Encounter for screening mammogram for malignant neoplasm of breast: Secondary | ICD-10-CM | POA: Insufficient documentation

## 2014-02-03 ENCOUNTER — Telehealth: Payer: Self-pay | Admitting: Adult Health

## 2014-02-03 MED ORDER — NITROFURANTOIN MONOHYD MACRO 100 MG PO CAPS: 100.0000 mg | ORAL_CAPSULE | Freq: Two times a day (BID) | ORAL | Status: DC

## 2014-02-03 NOTE — Telephone Encounter (Signed)
Leaving for [place] having UTI symptoms will rx macrobid

## 2014-02-23 ENCOUNTER — Ambulatory Visit: Payer: 59 | Admitting: Adult Health

## 2014-02-24 ENCOUNTER — Encounter: Payer: Self-pay | Admitting: Adult Health

## 2014-02-24 ENCOUNTER — Ambulatory Visit (INDEPENDENT_AMBULATORY_CARE_PROVIDER_SITE_OTHER): Payer: 59 | Admitting: Adult Health

## 2014-02-24 VITALS — BP 122/86 | Ht 66.0 in | Wt 240.0 lb

## 2014-02-24 DIAGNOSIS — Z713 Dietary counseling and surveillance: Secondary | ICD-10-CM

## 2014-02-24 DIAGNOSIS — Z6838 Body mass index (BMI) 38.0-38.9, adult: Secondary | ICD-10-CM | POA: Diagnosis not present

## 2014-02-24 DIAGNOSIS — E669 Obesity, unspecified: Secondary | ICD-10-CM

## 2014-02-24 MED ORDER — LORCASERIN HCL 10 MG PO TABS: ORAL_TABLET | ORAL | Status: DC

## 2014-02-24 NOTE — Patient Instructions (Signed)
Continue weight loss  Follow up in 4 weeks

## 2014-02-24 NOTE — Progress Notes (Signed)
Subjective:     Patient ID: Amanda Higgins, female   DOB: 10-12-1960, 54 y.o.   MRN: [id number]  HPI [name] is a 54 year old white female in for refill on belviq, has been taking once a day but is increasing to 2 daily,Went on cruise and still lost 1.5 lbs.  Review of Systems See HPI Reviewed past medical,surgical, social and family history. Reviewed medications and allergies.     Objective:   Physical Exam BP 122/86 mmHg  Ht 5\' 6"  (1.676 m)  Wt 240 lb (108.863 kg)  BMI 38.76 kg/m2   Skin warm and dry. Lungs: clear to ausculation bilaterally. Cardiovascular: regular rate and rhythm.Has lost 8.5 lbs total.  Assessment:     Obesity     Plan:     Refilled Belviq 10 mg #30 1 daily Follow up in 4 weeks

## 2014-03-24 ENCOUNTER — Ambulatory Visit: Payer: 59 | Admitting: Adult Health

## 2014-03-25 ENCOUNTER — Telehealth: Payer: Self-pay | Admitting: Adult Health

## 2014-03-25 MED ORDER — VALACYCLOVIR HCL 1 G PO TABS: ORAL_TABLET | ORAL | Status: DC

## 2014-03-25 NOTE — Telephone Encounter (Signed)
Called requests valtrex for fever blister, will rx

## 2014-05-18 ENCOUNTER — Ambulatory Visit (INDEPENDENT_AMBULATORY_CARE_PROVIDER_SITE_OTHER): Payer: 59 | Admitting: Adult Health

## 2014-05-18 ENCOUNTER — Encounter: Payer: Self-pay | Admitting: Adult Health

## 2014-05-18 VITALS — BP 124/84 | HR 80 | Ht 65.5 in | Wt 243.0 lb

## 2014-05-18 DIAGNOSIS — Z713 Dietary counseling and surveillance: Secondary | ICD-10-CM

## 2014-05-18 DIAGNOSIS — E669 Obesity, unspecified: Secondary | ICD-10-CM

## 2014-05-18 DIAGNOSIS — Z6839 Body mass index (BMI) 39.0-39.9, adult: Secondary | ICD-10-CM

## 2014-05-18 MED ORDER — LORCASERIN HCL 10 MG PO TABS: ORAL_TABLET | ORAL | Status: DC

## 2014-05-18 NOTE — Progress Notes (Signed)
Subjective:     Patient ID: Amanda Higgins, female   DOB: Jan 11, 1961, 54 y.o.   MRN: [id number]  HPI [name] is a 54 year old white female in to check weight and BP and get refill on belviq. No complaints.  Review of Systems  Patient denies any headaches, hearing loss, fatigue, blurred vision, shortness of breath, chest pain, abdominal pain, problems with bowel movements, urination, or intercourse. No joint pain or mood swings. Reviewed past medical,surgical, social and family history. Reviewed medications and allergies.     Objective:   Physical Exam BP 124/84 mmHg  Pulse 80  Ht 5' 5.5" (1.664 m)  Wt 243 lb (110.224 kg)  BMI 39.81 kg/m2   Has gained 3 lbs, but has not been taking bid, wants to try taking bid, talk only, face time 10 minutes, then power outage.  Assessment:    Obesity       Plan:    Refilled belviq 10 mg #60 take 1 bid no refills Follow up in 4 weeks   Continue weight loss efforts

## 2014-05-18 NOTE — Patient Instructions (Signed)
Follow up in 4 weeks Continue weight loss

## 2014-09-14 ENCOUNTER — Telehealth: Payer: Self-pay | Admitting: Adult Health

## 2014-09-14 MED ORDER — NITROFURANTOIN MONOHYD MACRO 100 MG PO CAPS: 100.0000 mg | ORAL_CAPSULE | Freq: Two times a day (BID) | ORAL | Status: DC

## 2014-09-14 NOTE — Telephone Encounter (Signed)
Pt complains of UTI will rx macrobid

## 2014-12-25 ENCOUNTER — Other Ambulatory Visit: Payer: Self-pay | Admitting: Adult Health

## 2015-01-26 ENCOUNTER — Telehealth: Payer: Self-pay | Admitting: Obstetrics & Gynecology

## 2015-01-26 MED ORDER — ESTRADIOL-LEVONORGESTREL 0.045-0.015 MG/DAY TD PTWK: 1.0000 | MEDICATED_PATCH | TRANSDERMAL | Status: DC

## 2015-01-26 NOTE — Telephone Encounter (Signed)
Pt states Combipatch on back order until February per Gerald Stabs, Pharmacist with [facility], suggest Rx for Climara Pro patch which is very similar to the combipatch.   Pt requesting Rx for the Climara Pro Patch to be sent to [place] patient pharmacy.

## 2015-01-26 NOTE — Telephone Encounter (Signed)
Pt called stating that she would like for a doctor to call in another prescription for her patches. Please contact pt

## 2015-02-19 ENCOUNTER — Other Ambulatory Visit: Payer: Self-pay | Admitting: Adult Health

## 2015-02-19 DIAGNOSIS — Z1231 Encounter for screening mammogram for malignant neoplasm of breast: Secondary | ICD-10-CM

## 2015-02-25 MED FILL — LEVOTHYROXINE 25 MCG TABLET: 25 | 30 days supply | Qty: 30 | Fill #3

## 2015-02-25 MED FILL — CLIMARA PRO PATCH: 0.045-0.015 | 28 days supply | Qty: 4 | Fill #1

## 2015-03-04 ENCOUNTER — Ambulatory Visit (HOSPITAL_COMMUNITY)
Admission: RE | Admit: 2015-03-04 | Discharge: 2015-03-04 | Disposition: A | Discharge status: Home / Self Care | Payer: 59 | Source: Ambulatory Visit | Attending: Adult Health | Admitting: Adult Health

## 2015-03-04 DIAGNOSIS — Z1231 Encounter for screening mammogram for malignant neoplasm of breast: Secondary | ICD-10-CM | POA: Diagnosis not present

## 2015-03-23 MED FILL — CLIMARA PRO PATCH: 0.045-0.015 | 28 days supply | Qty: 4 | Fill #2

## 2015-03-29 MED FILL — LEVOTHYROXINE 25 MCG TABLET: 25 | 30 days supply | Qty: 30 | Fill #0

## 2015-04-01 DIAGNOSIS — E2839 Other primary ovarian failure: Secondary | ICD-10-CM | POA: Diagnosis not present

## 2015-04-01 DIAGNOSIS — E559 Vitamin D deficiency, unspecified: Secondary | ICD-10-CM | POA: Diagnosis not present

## 2015-04-01 DIAGNOSIS — E785 Hyperlipidemia, unspecified: Secondary | ICD-10-CM | POA: Diagnosis not present

## 2015-04-01 DIAGNOSIS — R739 Hyperglycemia, unspecified: Secondary | ICD-10-CM | POA: Diagnosis not present

## 2015-04-01 DIAGNOSIS — E039 Hypothyroidism, unspecified: Secondary | ICD-10-CM | POA: Diagnosis not present

## 2015-04-19 MED FILL — CLIMARA PRO PATCH: 0.045-0.015 | 28 days supply | Qty: 4 | Fill #3

## 2015-05-03 MED FILL — LEVOTHYROXINE 25 MCG TABLET: 25 | 30 days supply | Qty: 30 | Fill #1

## 2015-05-17 MED FILL — CLIMARA PRO PATCH: 0.045-0.015 | 28 days supply | Qty: 4 | Fill #4

## 2015-05-31 MED FILL — LEVOTHYROXINE 25 MCG TABLET: 25 | 30 days supply | Qty: 30 | Fill #2

## 2015-06-16 MED FILL — CLIMARA PRO PATCH: 0.045-0.015 | 28 days supply | Qty: 4 | Fill #5

## 2015-07-13 MED FILL — CLIMARA PRO PATCH: 0.045-0.015 | 28 days supply | Qty: 4 | Fill #6

## 2015-08-11 ENCOUNTER — Telehealth: Payer: Self-pay | Admitting: Adult Health

## 2015-08-11 MED ORDER — NITROFURANTOIN MONOHYD MACRO 100 MG PO CAPS: 100.0000 mg | ORAL_CAPSULE | Freq: Two times a day (BID) | ORAL | Status: DC

## 2015-08-11 NOTE — Telephone Encounter (Signed)
Pt called has UTI, requests macrobid, will send Rx to [facility]  pharmacy

## 2015-08-13 MED FILL — CLIMARA PRO PATCH: 0.045-0.015 | 28 days supply | Qty: 4 | Fill #7

## 2015-09-09 MED FILL — CLIMARA PRO PATCH: 0.045-0.015 | 28 days supply | Qty: 4 | Fill #8

## 2015-09-10 ENCOUNTER — Telehealth: Payer: Self-pay | Admitting: Adult Health

## 2015-09-10 MED ORDER — NITROFURANTOIN MONOHYD MACRO 100 MG PO CAPS: 100.0000 mg | ORAL_CAPSULE | Freq: Two times a day (BID) | ORAL | 0 refills | Status: DC

## 2015-09-10 NOTE — Telephone Encounter (Signed)
Amanda Higgins is having UTI symptoms and has appt 8/18 , will rx macrobid and check urine at appt

## 2015-09-24 ENCOUNTER — Other Ambulatory Visit (HOSPITAL_COMMUNITY)
Admission: RE | Admit: 2015-09-24 | Discharge: 2015-09-24 | Disposition: A | Discharge status: Home / Self Care | Payer: 59 | Source: Ambulatory Visit | Attending: Adult Health | Admitting: Adult Health

## 2015-09-24 ENCOUNTER — Encounter: Payer: Self-pay | Admitting: Adult Health

## 2015-09-24 ENCOUNTER — Ambulatory Visit (INDEPENDENT_AMBULATORY_CARE_PROVIDER_SITE_OTHER): Payer: 59 | Admitting: Adult Health

## 2015-09-24 VITALS — BP 120/88 | HR 80 | Ht 65.75 in | Wt 255.5 lb

## 2015-09-24 DIAGNOSIS — Z01419 Encounter for gynecological examination (general) (routine) without abnormal findings: Secondary | ICD-10-CM

## 2015-09-24 DIAGNOSIS — Z124 Encounter for screening for malignant neoplasm of cervix: Secondary | ICD-10-CM | POA: Diagnosis not present

## 2015-09-24 DIAGNOSIS — N950 Postmenopausal bleeding: Secondary | ICD-10-CM

## 2015-09-24 DIAGNOSIS — Z1212 Encounter for screening for malignant neoplasm of rectum: Secondary | ICD-10-CM

## 2015-09-24 DIAGNOSIS — Z1151 Encounter for screening for human papillomavirus (HPV): Secondary | ICD-10-CM | POA: Diagnosis not present

## 2015-09-24 DIAGNOSIS — Z79890 Hormone replacement therapy (postmenopausal): Secondary | ICD-10-CM

## 2015-09-24 HISTORY — DX: Postmenopausal bleeding: N95.0

## 2015-09-24 LAB — HEMOCCULT GUIAC POC 1CARD (OFFICE): Fecal Occult Blood, POC: NEGATIVE

## 2015-09-24 NOTE — Progress Notes (Signed)
Patient ID: Amanda Higgins, female   DOB: [birth year], 55 y.o.   MRN: [id number] History of Present Illness: Amanda Higgins is a 55 year old white female, married in for a well woman gyn exam and pap,she had reaction to macrobid recently.She is on combipatch and likes it but has has some spotting when time to change it.   Current Medications, Allergies, Past Medical History, Past Surgical History, Family History and Social History were reviewed in [employer] record.     Review of Systems:  Patient denies any headaches, hearing loss, fatigue, blurred vision, shortness of breath, chest pain, abdominal pain, problems with bowel movements, urination, or intercourse. No joint pain or mood swings.See HPI for positives.   Physical Exam:BP 120/88 (BP Location: Left Arm, Patient Position: Sitting, Cuff Size: Large)   Pulse 80   Ht 5' 5.75" (1.67 m)   Wt 255 lb 8 oz (115.9 kg)   BMI 41.55 kg/m  General:  Well developed, well nourished, no acute distress Skin:  Warm and dry Neck:  Midline trachea, normal thyroid, good ROM, no lymphadenopathy Lungs; Clear to auscultation bilaterally Breast:  No dominant palpable mass, retraction, or nipple discharge Cardiovascular: Regular rate and rhythm Abdomen:  Soft, non tender, no hepatosplenomegaly Pelvic:  External genitalia is normal in appearance, no lesions.  The vagina is normal in appearance. Urethra has no lesions or masses. The cervix is bulbous. Pap with HPV performed. Uterus is felt to be normal size, shape, and contour.  No adnexal masses or tenderness noted.Bladder is non tender, no masses felt. Rectal: Good sphincter tone, no polyps, or hemorrhoids felt.  Hemoccult negative. Extremities/musculoskeletal:  No swelling or varicosities noted, no clubbing or cyanosis Psych:  No mood changes, alert and cooperative,seems happy Will [place] to assess uterus.  Impression:  Encounter for gynecological examination with Papanicolaou smear of  cervix - Plan: Cytology - PAP, POCT occult blood stool, CBC, Comprehensive metabolic panel, TSH, Lipid panel, Hemoglobin A1c, VITAMIN D 25 Hydroxy (Vit-D Deficiency, Fractures)  PMB (postmenopausal bleeding) - Plan: US Pelvis Complete, US Transvaginal Non-OB  Hormone replacement therapy (HRT)    Plan: Check CBC,CMP,TSH and lipids,A1c and vitamin D Return in 4 days for gyn [place] Physical in 1 year, pap in 3 if normal Mammogram yearly Colonoscopy per GI Continue combi patch has refills

## 2015-09-24 NOTE — Patient Instructions (Signed)
Return in 4 days for GYN [place] Physical in 1 year pap in 3 if normal Mammogram yearly  Colonoscopy per GI

## 2015-09-27 LAB — COMPREHENSIVE METABOLIC PANEL
ALBUMIN: 4.1 g/dL (ref 3.5–5.5)
ALK PHOS: 68 IU/L (ref 39–117)
ALT: 20 IU/L (ref 0–32)
AST: 16 IU/L (ref 0–40)
Albumin/Globulin Ratio: 1.3 (ref 1.2–2.2)
BILIRUBIN TOTAL: 0.5 mg/dL (ref 0.0–1.2)
BUN / CREAT RATIO: 15 (ref 9–23)
BUN: 12 mg/dL (ref 6–24)
CHLORIDE: 99 mmol/L (ref 96–106)
CO2: 25 mmol/L (ref 18–29)
Calcium: 9.1 mg/dL (ref 8.7–10.2)
Creatinine, Ser: 0.81 mg/dL (ref 0.57–1.00)
GFR calc Af Amer: 94 mL/min/{1.73_m2} (ref >59)
GFR calc non Af Amer: 81 mL/min/{1.73_m2} (ref >59)
GLOBULIN, TOTAL: 3.1 g/dL (ref 1.5–4.5)
Glucose: 97 mg/dL (ref 65–99)
POTASSIUM: 4.4 mmol/L (ref 3.5–5.2)
SODIUM: 139 mmol/L (ref 134–144)
Total Protein: 7.2 g/dL (ref 6.0–8.5)

## 2015-09-27 LAB — CBC
HEMATOCRIT: 43.9 % (ref 34.0–46.6)
HEMOGLOBIN: 14.2 g/dL (ref 11.1–15.9)
MCH: 30 pg (ref 26.6–33.0)
MCHC: 32.3 g/dL (ref 31.5–35.7)
MCV: 93 fL (ref 79–97)
Platelets: 244 10*3/uL (ref 150–379)
RBC: 4.73 x10E6/uL (ref 3.77–5.28)
RDW: 13.6 % (ref 12.3–15.4)
WBC: 7.6 10*3/uL (ref 3.4–10.8)

## 2015-09-27 LAB — LIPID PANEL
Chol/HDL Ratio: 5.7 ratio units — ABNORMAL HIGH (ref 0.0–4.4)
Cholesterol, Total: 251 mg/dL — ABNORMAL HIGH (ref 100–199)
HDL: 44 mg/dL (ref >39)
LDL Calculated: 163 mg/dL — ABNORMAL HIGH (ref 0–99)
Triglycerides: 218 mg/dL — ABNORMAL HIGH (ref 0–149)
VLDL Cholesterol Cal: 44 mg/dL — ABNORMAL HIGH (ref 5–40)

## 2015-09-27 LAB — HEMOGLOBIN A1C
ESTIMATED AVERAGE GLUCOSE: 114 mg/dL
HEMOGLOBIN A1C: 5.6 % (ref 4.8–5.6)

## 2015-09-27 LAB — VITAMIN D 25 HYDROXY (VIT D DEFICIENCY, FRACTURES): Vit D, 25-Hydroxy: 45.3 ng/mL (ref 30.0–100.0)

## 2015-09-27 LAB — TSH: TSH: 2.56 u[IU]/mL (ref 0.450–4.500)

## 2015-09-28 ENCOUNTER — Ambulatory Visit (INDEPENDENT_AMBULATORY_CARE_PROVIDER_SITE_OTHER): Payer: 59

## 2015-09-28 DIAGNOSIS — N950 Postmenopausal bleeding: Secondary | ICD-10-CM | POA: Diagnosis not present

## 2015-09-28 DIAGNOSIS — N854 Malposition of uterus: Secondary | ICD-10-CM | POA: Diagnosis not present

## 2015-09-28 NOTE — Progress Notes (Signed)
PELVIC US TA/TV: Homogeneous anteverted uterus wnl,EEC 4.7 cm,normal ov's bilat (mobile),no free fluid,no pain during ultrasound

## 2015-09-29 ENCOUNTER — Telehealth: Payer: Self-pay | Admitting: Adult Health

## 2015-09-29 LAB — CYTOLOGY - PAP

## 2015-09-29 NOTE — Telephone Encounter (Signed)
Left message to call about labs and US 

## 2015-10-01 ENCOUNTER — Telehealth: Payer: Self-pay | Admitting: Adult Health

## 2015-10-01 ENCOUNTER — Encounter: Payer: Self-pay | Admitting: Adult Health

## 2015-10-01 DIAGNOSIS — E782 Mixed hyperlipidemia: Secondary | ICD-10-CM

## 2015-10-01 DIAGNOSIS — E785 Hyperlipidemia, unspecified: Secondary | ICD-10-CM | POA: Insufficient documentation

## 2015-10-01 HISTORY — DX: Mixed hyperlipidemia: E78.2

## 2015-10-01 MED ORDER — PHENTERMINE HCL 15 MG PO CAPS: 15.0000 mg | ORAL_CAPSULE | ORAL | 0 refills | Status: DC

## 2015-10-01 MED FILL — PHENTERMINE 15 MG CAPSULE: 15 | 30 days supply | Qty: 30 | Fill #0

## 2015-10-01 NOTE — Telephone Encounter (Signed)
Pt aware [place] normal and of labs with elevated chol and trig, A1c 5.6 needs to decrease carbs and exercise and she wants to lose weight will rx phentermine 15 mg and will check labs in 3 months as she declines statin

## 2015-10-04 MED FILL — CLIMARA PRO PATCH: 0.045-0.015 | 28 days supply | Qty: 4 | Fill #9

## 2015-11-01 MED FILL — CLIMARA PRO PATCH: 0.045-0.015 | 28 days supply | Qty: 4 | Fill #10

## 2015-11-30 MED FILL — CLIMARA PRO PATCH: 0.045-0.015 | 28 days supply | Qty: 4 | Fill #11

## 2015-12-28 MED FILL — CLIMARA PRO PATCH: 0.045-0.015 | 28 days supply | Qty: 4 | Fill #12

## 2016-01-25 ENCOUNTER — Other Ambulatory Visit: Payer: Self-pay | Admitting: Adult Health

## 2016-01-25 ENCOUNTER — Other Ambulatory Visit: Payer: Self-pay | Admitting: Obstetrics & Gynecology

## 2016-01-25 MED ORDER — ESTRADIOL-LEVONORGESTREL 0.045-0.015 MG/DAY TD PTWK: 1.0000 | MEDICATED_PATCH | TRANSDERMAL | 12 refills | Status: DC

## 2016-01-25 MED FILL — CLIMARA PRO PATCH: 0.045-0.015 | 28 days supply | Qty: 4 | Fill #0

## 2016-02-04 DIAGNOSIS — H5203 Hypermetropia, bilateral: Secondary | ICD-10-CM | POA: Diagnosis not present

## 2016-02-21 MED FILL — CLIMARA PRO PATCH: 0.045-0.015 | 28 days supply | Qty: 4 | Fill #1

## 2016-03-20 ENCOUNTER — Telehealth: Payer: Self-pay | Admitting: Adult Health

## 2016-03-20 MED FILL — CLIMARA PRO PATCH: 0.045-0.015 | 28 days supply | Qty: 4 | Fill #2

## 2016-03-20 NOTE — Telephone Encounter (Signed)
Pt called stating she would like to speak with Anderson Malta regarding an appointment. Please contact pt

## 2016-03-20 NOTE — Telephone Encounter (Signed)
Left message that I called.

## 2016-04-17 MED FILL — CLIMARA PRO PATCH: 0.045-0.015 | 28 days supply | Qty: 4 | Fill #3

## 2016-05-12 ENCOUNTER — Other Ambulatory Visit: Payer: Self-pay | Admitting: Adult Health

## 2016-05-12 DIAGNOSIS — Z1231 Encounter for screening mammogram for malignant neoplasm of breast: Secondary | ICD-10-CM

## 2016-05-18 ENCOUNTER — Ambulatory Visit (HOSPITAL_COMMUNITY)
Admission: RE | Admit: 2016-05-18 | Discharge: 2016-05-18 | Disposition: A | Discharge status: Home / Self Care | Payer: 59 | Source: Ambulatory Visit | Attending: Adult Health | Admitting: Adult Health

## 2016-05-18 DIAGNOSIS — Z1231 Encounter for screening mammogram for malignant neoplasm of breast: Secondary | ICD-10-CM | POA: Insufficient documentation

## 2016-05-18 MED FILL — CLIMARA PRO PATCH: 0.045-0.015 | 28 days supply | Qty: 4 | Fill #4

## 2016-06-14 MED FILL — CLIMARA PRO PATCH: 0.045-0.015 | 28 days supply | Qty: 4 | Fill #5

## 2016-07-06 ENCOUNTER — Telehealth: Payer: Self-pay | Admitting: Adult Health

## 2016-07-06 NOTE — Telephone Encounter (Signed)
Pt called wanting to see Amanda Higgins sooner than the first appointment we have available on June 12. She stated that her BP was high last year and she was starting to feel like it was high again. She stated that she had not had her BP checked. I advised pt to go to [facility] or [facility] and check her BP. I advised her that if it was high then she should follow up with her PCP or go to urgent care because we do not have any available appts until then. Advised her we would call her if we had an opening.

## 2016-07-13 MED FILL — CLIMARA PRO PATCH: 0.045-0.015 | 28 days supply | Qty: 4 | Fill #6

## 2016-07-18 ENCOUNTER — Ambulatory Visit: Payer: 59 | Admitting: Adult Health

## 2016-08-14 MED FILL — CLIMARA PRO PATCH: 0.045-0.015 | 28 days supply | Qty: 4 | Fill #7

## 2016-09-02 ENCOUNTER — Emergency Department (HOSPITAL_COMMUNITY)
Admission: EM | Admit: 2016-09-02 | Discharge: 2016-09-02 | Disposition: A | Discharge status: Home / Self Care | Payer: 59 | Attending: Emergency Medicine | Admitting: Emergency Medicine

## 2016-09-02 ENCOUNTER — Emergency Department (HOSPITAL_COMMUNITY): Payer: 59

## 2016-09-02 ENCOUNTER — Encounter (HOSPITAL_COMMUNITY): Payer: Self-pay

## 2016-09-02 ENCOUNTER — Ambulatory Visit (HOSPITAL_COMMUNITY)
Admission: EM | Admit: 2016-09-02 | Discharge: 2016-09-02 | Disposition: A | Discharge status: Home / Self Care | Payer: 59 | Source: Home / Self Care

## 2016-09-02 DIAGNOSIS — H9319 Tinnitus, unspecified ear: Secondary | ICD-10-CM | POA: Diagnosis not present

## 2016-09-02 DIAGNOSIS — R079 Chest pain, unspecified: Secondary | ICD-10-CM | POA: Insufficient documentation

## 2016-09-02 DIAGNOSIS — H9313 Tinnitus, bilateral: Secondary | ICD-10-CM | POA: Diagnosis not present

## 2016-09-02 DIAGNOSIS — R0789 Other chest pain: Secondary | ICD-10-CM | POA: Diagnosis not present

## 2016-09-02 DIAGNOSIS — Z79899 Other long term (current) drug therapy: Secondary | ICD-10-CM | POA: Diagnosis not present

## 2016-09-02 LAB — BASIC METABOLIC PANEL
ANION GAP: 9 (ref 5–15)
BUN: 10 mg/dL (ref 6–20)
CALCIUM: 9.3 mg/dL (ref 8.9–10.3)
CHLORIDE: 104 mmol/L (ref 101–111)
CO2: 23 mmol/L (ref 22–32)
Creatinine, Ser: 0.81 mg/dL (ref 0.44–1.00)
GFR calc Af Amer: >60 mL/min (ref >60)
GFR calc non Af Amer: >60 mL/min (ref >60)
GLUCOSE: 116 mg/dL — AB (ref 65–99)
POTASSIUM: 4 mmol/L (ref 3.5–5.1)
Sodium: 136 mmol/L (ref 135–145)

## 2016-09-02 LAB — CBC
HEMATOCRIT: 44.1 % (ref 36.0–46.0)
HEMOGLOBIN: 14.6 g/dL (ref 12.0–15.0)
MCH: 29.9 pg (ref 26.0–34.0)
MCHC: 33.1 g/dL (ref 30.0–36.0)
MCV: 90.4 fL (ref 78.0–100.0)
Platelets: 251 10*3/uL (ref 150–400)
RBC: 4.88 MIL/uL (ref 3.87–5.11)
RDW: 13.3 % (ref 11.5–15.5)
WBC: 9.6 10*3/uL (ref 4.0–10.5)

## 2016-09-02 LAB — I-STAT TROPONIN, ED
Troponin i, poc: 0 ng/mL (ref 0.00–0.08)
Troponin i, poc: 0 ng/mL (ref 0.00–0.08)

## 2016-09-02 LAB — D-DIMER, QUANTITATIVE: D-Dimer, Quant: 0.5 ug/mL-FEU (ref 0.00–0.50)

## 2016-09-02 MED ORDER — OMEPRAZOLE 20 MG PO CPDR: 20.0000 mg | DELAYED_RELEASE_CAPSULE | Freq: Every day | ORAL | 0 refills | Status: DC

## 2016-09-02 NOTE — ED Notes (Signed)
Pt updated, requesting discharge Dr. Tomi Bamberger made aware.

## 2016-09-02 NOTE — Discharge Instructions (Signed)
Follow-up with your primary care doctor next week as planned, return to the ED as needed for worsening symptoms

## 2016-09-02 NOTE — ED Triage Notes (Signed)
Per Pt, Pt is coming from home with bilateral ear pain and chest pressure that started 24 hours ago. Pt denies any other symptoms, but is tearful and anxious at this time.

## 2016-09-02 NOTE — ED Provider Notes (Signed)
[place] [facility] DEPT Provider Note   CSN: [id number] Arrival date & time: 09/02/16  1215     History   Chief Complaint Chief Complaint  Patient presents with  . Chest Pain    HPI Amanda Higgins is a 56 y.o. female.  HPI Yest woke up with malaise.  Also felt a ringing in her ears and some chest pressures.  Still able to work.  Sx persisted throughout the day.   Called the doctor for appt. on Monday.  Woke up this am with same sx.  Went to urgent care and was sent to the ED.    Chest pressure is constant.  Mild but noticeable.  Has had anxiety issues and felt similar in the past but is not feeling anxious now.    No shortness of breath.  No diaphoresis.  No leg swelling.   No hx of HTN, or heart disease.    Past Medical History:  Diagnosis Date  . Elevated cholesterol with elevated triglycerides 10/01/2015  . GERD (gastroesophageal reflux disease)   . Hematuria 12/11/2012  . Hx: UTI (urinary tract infection)   . Left shoulder pain    Nerve impingement  . Obesity   . PMB (postmenopausal bleeding) 09/24/2015   Usually at time of changing patches   . Postmenopausal HRT (hormone replacement therapy) 12/17/2012   On combi patch  . Urinary frequency 07/11/2013  . Urinary pain 12/11/2012     Past Surgical History:  Procedure Laterality Date  . COLONOSCOPY WITH ESOPHAGOGASTRODUODENOSCOPY (EGD) N/A 10/18/2012   Procedure: COLONOSCOPY WITH ESOPHAGOGASTRODUODENOSCOPY (EGD);  Surgeon: Daneil Dolin, MD;  Location: [facility] SU[facility];  Service: Endoscopy;  Laterality: N/A;  9:45 AM  . LAPAROSCOPIC CHOLECYSTECTOMY      OB History    Gravida Para Term Preterm AB Living   2 2       2    SAB TAB Ectopic Multiple Live Births           2       Home Medications    Prior to Admission medications   Medication Sig Start Date End Date Taking? Authorizing Provider  cholecalciferol (VITAMIN D) 1000 units tablet Take 5,000 Units by mouth daily.    [provider]  CLIMARA PRO  0.045-0.015 MG/DAY PLACE 1 PATCH ONTO THE SKIN ONCE A WEEK. 01/25/16   Florian Buff, MD  estradiol-levonorgestrel [facility]) 0.045-0.015 MG/DAY Place 1 patch onto the skin once a week. 01/25/16   Estill Dooms, NP  ibuprofen (ADVIL,MOTRIN) 200 MG tablet Take 200 mg by mouth every 6 (six) hours as needed for pain. As needed    [provider]  omeprazole (PRILOSEC) 20 MG capsule Take 1 capsule (20 mg total) by mouth daily. 09/02/16   Dorie Rank, MD  phentermine 15 MG capsule Take 1 capsule (15 mg total) by mouth every morning. 10/01/15   Estill Dooms, NP  vitamin C (ASCORBIC ACID) 500 MG tablet Take 500 mg by mouth daily.    [provider]    Family History Family History  Problem Relation Age of Onset  . Hyperlipidemia Mother   . Hypertension Mother   . GER disease Mother   . Heart disease Father        atrial fib  . Hypertension Father   . Other Sister        precancerous breast lesion  . Colon cancer Neg Hx     Social History Social History  Substance Use Topics  . Smoking  status: Never Smoker  . Smokeless tobacco: Never Used  . Alcohol use Yes     Comment: occasional wine     Allergies   Macrobid [nitrofurantoin monohyd macro]   Review of Systems Review of Systems  HENT: Positive for tinnitus.   All other systems reviewed and are negative.    Physical Exam Updated Vital Signs BP 117/64   Pulse 93   Temp 99.1 F (37.3 C) (Oral)   Resp 15   Ht 1.676 m (5\' 6" )   Wt 115.7 kg (255 lb)   SpO2 97%   BMI 41.16 kg/m   Physical Exam  Constitutional: She appears well-developed and well-nourished. No distress.  HENT:  Head: Normocephalic and atraumatic.  Right Ear: External ear normal.  Left Ear: External ear normal.  Eyes: Conjunctivae are normal. Right eye exhibits no discharge. Left eye exhibits no discharge. No scleral icterus.  Neck: Neck supple. No tracheal deviation present.  Cardiovascular: Normal rate, regular rhythm  and intact distal pulses.   Pulmonary/Chest: Effort normal and breath sounds normal. No stridor. No respiratory distress. She has no wheezes. She has no rales.  Abdominal: Soft. Bowel sounds are normal. She exhibits no distension. There is no tenderness. There is no rebound and no guarding.  Musculoskeletal: She exhibits no edema or tenderness.  Varicose veins, no edema or tenderness  Neurological: She is alert. She has normal strength. No cranial nerve deficit (no facial droop, extraocular movements intact, no slurred speech) or sensory deficit. She exhibits normal muscle tone. She displays no seizure activity. Coordination normal.  Skin: Skin is warm and dry. No rash noted.  Psychiatric: She has a normal mood and affect.  Nursing note and vitals reviewed.    ED Treatments / Results  Labs (all labs ordered are listed, but only abnormal results are displayed) Labs Reviewed  BASIC METABOLIC PANEL - Abnormal; Notable for the following:       Result Value   Glucose, Bld 116 (*)    All other components within normal limits  CBC  D-DIMER, QUANTITATIVE (NOT AT [facility])  I-STAT TROPONIN, ED  I-STAT TROPONIN, ED    EKG  EKG Interpretation  Date/Time:  Saturday September 02 2016 12:21:24 EDT Ventricular Rate:  121 PR Interval:  150 QRS Duration: 66 QT Interval:  300 QTC Calculation: 426 R Axis:   30 Text Interpretation:  Sinus tachycardia Anterior infarct , age undetermined Abnormal ECG No old tracing to compare Confirmed by Dorie Rank [phone]) on 09/02/2016 3:02:43 PM       Radiology Dg Chest 2 View  Result Date: 09/02/2016 CLINICAL DATA:  Bilateral ear pain and chest pressure. EXAM: CHEST  2 VIEW COMPARISON:  None. FINDINGS: The heart size and mediastinal contours are within normal limits. Both lungs are clear. The visualized skeletal structures are unremarkable. IMPRESSION: No active cardiopulmonary disease. Electronically Signed   By: Kerby Moors M.D.   On: 09/02/2016 12:52     Procedures Procedures (including critical care time)  Medications Ordered in ED Medications - No data to display   Initial Impression / Assessment and Plan / ED Course  I have reviewed the triage vital signs and the nursing notes.  Pertinent labs & imaging results that were available during my care of the patient were reviewed by me and considered in my medical decision making (see chart for details).   patient presented to the emergency room with complaints of chest discomfort that started about 24 hours ago. Patient's symptoms overall atypical for heart  disease. Heart score is 3.  Overall patient is low risk for acute coronary syndrome.  D-dimer is negative. I doubt pulmonary embolism.  Symptoms could be related to gastroesophageal reflux. I will have her take Prilosec. She has a follow-up appt  with her doctor on Monday.  Patient is having tinnitus.  She has a normal neurologic exam.  We discussed outpatient follow-up with her primary care doctor.  Final Clinical Impressions(s) / ED Diagnoses   Final diagnoses:  Chest pain, unspecified type  Tinnitus, unspecified laterality    New Prescriptions New Prescriptions   OMEPRAZOLE (PRILOSEC) 20 MG CAPSULE    Take 1 capsule (20 mg total) by mouth daily.     Dorie Rank, MD 09/02/16 [phone]

## 2016-09-02 NOTE — ED Notes (Signed)
Pt stable, ambulatory, states understanding of discharge instructions 

## 2016-09-04 ENCOUNTER — Encounter: Payer: Self-pay | Admitting: Adult Health

## 2016-09-04 ENCOUNTER — Ambulatory Visit (INDEPENDENT_AMBULATORY_CARE_PROVIDER_SITE_OTHER): Payer: 59 | Admitting: Adult Health

## 2016-09-04 VITALS — BP 130/82 | HR 99 | Ht 66.0 in | Wt 264.0 lb

## 2016-09-04 DIAGNOSIS — G4452 New daily persistent headache (NDPH): Secondary | ICD-10-CM

## 2016-09-04 DIAGNOSIS — Z87898 Personal history of other specified conditions: Secondary | ICD-10-CM | POA: Diagnosis not present

## 2016-09-04 DIAGNOSIS — H9313 Tinnitus, bilateral: Secondary | ICD-10-CM | POA: Insufficient documentation

## 2016-09-04 DIAGNOSIS — E785 Hyperlipidemia, unspecified: Secondary | ICD-10-CM | POA: Diagnosis not present

## 2016-09-04 NOTE — Progress Notes (Addendum)
Subjective:     Patient ID: Amanda Higgins, female   DOB: Jan 14, 1961, 56 y.o.   MRN: [id number]  HPI Amanda Higgins is a 56 year old white female, married,PM on HRT in complaining of +ringing in both ears, and dizzy at times, started Friday with headache.Woke up Saturday and had some chest pain and was seen in ER at [facility].And complains of weight gain. Labs in ER were normal, but EKG showed sinus tachycardia and ?antereior infarct, age undetermined.Has had elevated lipids, without meds.She said she had 2 fillings last Monday and they were uncomfortable.   Review of Systems +ringing in both ears New daily headache Had chest pain Saturday Weight gain Dizzy at times  Reviewed past medical,surgical, social and family history. Reviewed medications and allergies.     Objective:   Physical Exam BP 130/82 (BP Location: Right Arm, Patient Position: Sitting, Cuff Size: Large)   Pulse 99   Ht 5\' 6"  (1.676 m)   Wt 264 lb (119.7 kg)   BMI 42.61 kg/m    Skin warm and dry. Neck: mid line trachea, normal thyroid, good ROM, no lymphadenopathy noted,no carotid bruits heard. Lungs: clear to ausculation bilaterally. Cardiovascular: regular rate and rhythm. Ears clear.No facial tenderness noted over sinuses. CN 2-12 intact. Discussed getting CT of head and cardiology referral and she agrees, may need ENT referral, if tinnitus continues.  Face time 25 minutes with 50 % counseling and coordinating care.   Assessment:     1. Tinnitus of both ears   2. New daily persistent headache   3. Elevated lipids   4. History of chest pain       Plan:    CT of head w/o contrast scheduled 09/08/16 at 7 pm at [facility] Check CMP and lipids fasting Referred to cardiology, appt 09/18/16 at 10:40 am with Dr Johnsie Cancel Follow up in 3 weeks  If any symptoms change go to ER

## 2016-09-05 ENCOUNTER — Ambulatory Visit (HOSPITAL_COMMUNITY)
Admission: RE | Admit: 2016-09-05 | Discharge: 2016-09-05 | Disposition: A | Discharge status: Home / Self Care | Payer: 59 | Source: Ambulatory Visit | Attending: Adult Health | Admitting: Adult Health

## 2016-09-05 ENCOUNTER — Other Ambulatory Visit: Payer: Self-pay | Admitting: Adult Health

## 2016-09-05 ENCOUNTER — Telehealth: Payer: Self-pay | Admitting: Adult Health

## 2016-09-05 DIAGNOSIS — G4452 New daily persistent headache (NDPH): Secondary | ICD-10-CM | POA: Insufficient documentation

## 2016-09-05 DIAGNOSIS — H9313 Tinnitus, bilateral: Secondary | ICD-10-CM

## 2016-09-05 DIAGNOSIS — R51 Headache: Secondary | ICD-10-CM | POA: Diagnosis not present

## 2016-09-05 DIAGNOSIS — E785 Hyperlipidemia, unspecified: Secondary | ICD-10-CM | POA: Diagnosis not present

## 2016-09-05 NOTE — Telephone Encounter (Signed)
Pt to get CT today instead of Friday.She had labs this morning and added TSH and free T4 to labs.

## 2016-09-05 NOTE — Telephone Encounter (Signed)
Pt aware CT was negative

## 2016-09-06 ENCOUNTER — Telehealth: Payer: Self-pay | Admitting: Adult Health

## 2016-09-06 LAB — COMPREHENSIVE METABOLIC PANEL
ALK PHOS: 75 IU/L (ref 39–117)
ALT: 25 IU/L (ref 0–32)
AST: 22 IU/L (ref 0–40)
Albumin/Globulin Ratio: 1.3 (ref 1.2–2.2)
Albumin: 4 g/dL (ref 3.5–5.5)
BILIRUBIN TOTAL: 0.7 mg/dL (ref 0.0–1.2)
BUN / CREAT RATIO: 11 (ref 9–23)
BUN: 9 mg/dL (ref 6–24)
CHLORIDE: 101 mmol/L (ref 96–106)
CO2: 24 mmol/L (ref 20–29)
Calcium: 9.2 mg/dL (ref 8.7–10.2)
Creatinine, Ser: 0.81 mg/dL (ref 0.57–1.00)
GFR calc Af Amer: 93 mL/min/{1.73_m2} (ref >59)
GFR calc non Af Amer: 81 mL/min/{1.73_m2} (ref >59)
GLUCOSE: 107 mg/dL — AB (ref 65–99)
Globulin, Total: 3.1 g/dL (ref 1.5–4.5)
Potassium: 4 mmol/L (ref 3.5–5.2)
Sodium: 140 mmol/L (ref 134–144)
Total Protein: 7.1 g/dL (ref 6.0–8.5)

## 2016-09-06 LAB — LIPID PANEL
CHOLESTEROL TOTAL: 238 mg/dL — AB (ref 100–199)
Chol/HDL Ratio: 5.5 ratio — ABNORMAL HIGH (ref 0.0–4.4)
HDL: 43 mg/dL (ref >39)
LDL Calculated: 165 mg/dL — ABNORMAL HIGH (ref 0–99)
Triglycerides: 152 mg/dL — ABNORMAL HIGH (ref 0–149)
VLDL CHOLESTEROL CAL: 30 mg/dL (ref 5–40)

## 2016-09-06 LAB — TSH: TSH: 3.52 u[IU]/mL (ref 0.450–4.500)

## 2016-09-06 LAB — T4, FREE: FREE T4: 1.23 ng/dL (ref 0.82–1.77)

## 2016-09-06 NOTE — Telephone Encounter (Signed)
Left message about labs, lipid panel better, but not great, will wait til cardiology consult, on any meds, so for now keep watching diet,and exercise

## 2016-09-08 ENCOUNTER — Ambulatory Visit (HOSPITAL_COMMUNITY): Payer: 59

## 2016-09-11 MED FILL — CLIMARA PRO PATCH: 0.045-0.015 | 28 days supply | Qty: 4 | Fill #8

## 2016-09-17 NOTE — Progress Notes (Signed)
Cardiology Office Note   Date:  09/18/2016   ID:  Amanda Higgins, DOB 1960/03/06, MRN [id number]  PCP:  Estill Dooms, NP  Cardiologist:   Jenkins Rouge, MD   No chief complaint on file.     History of Present Illness: Amanda Higgins is a 56 y.o. female who presents for consultation regarding chest pain and HTN. Also had elevated lipids Referred by Dr Laurann Montana Recently had dental visit with fillings. Headache and tinnitus. Malaise. Seen in ER 09/02/16 for chest pain. Pain constant History of anxiety No dyspnea palpitations or syncope. R/O d dimer negative CXR NAD f/u CT head normal on 08/08/16  Labs reviewed and fine TSH pending LDL is elevated at 165 not on Rx yet  She has not had any issues last 2 weeks. She works in Economist at [employer] She is overweight and sedentary Walked and saw Anastasio Champion to try to get in better shape last year but hurt her right achilles  Has two older daughters in [place]  Past Medical History:  Diagnosis Date  . Elevated cholesterol with elevated triglycerides 10/01/2015  . GERD (gastroesophageal reflux disease)   . Hematuria 12/11/2012  . Hx: UTI (urinary tract infection)   . Left shoulder pain    Nerve impingement  . Obesity   . PMB (postmenopausal bleeding) 09/24/2015   Usually at time of changing patches   . Postmenopausal HRT (hormone replacement therapy) 12/17/2012   On combi patch  . Urinary frequency 07/11/2013  . Urinary pain 12/11/2012    Past Surgical History:  Procedure Laterality Date  . COLONOSCOPY WITH ESOPHAGOGASTRODUODENOSCOPY (EGD) N/A 10/18/2012   Procedure: COLONOSCOPY WITH ESOPHAGOGASTRODUODENOSCOPY (EGD);  Surgeon: Daneil Dolin, MD;  Location: [facility];  Service: Endoscopy;  Laterality: N/A;  9:45 AM  . LAPAROSCOPIC CHOLECYSTECTOMY       Current Outpatient Prescriptions  Medication Sig Dispense Refill  . aspirin 81 MG chewable tablet Chew 81 mg by mouth daily.    . cholecalciferol (VITAMIN D) 1000 units tablet Take  5,000 Units by mouth daily.    Marland Kitchen CLIMARA PRO 0.045-0.015 MG/DAY PLACE 1 PATCH ONTO THE SKIN ONCE A WEEK. 4 patch 12  . ibuprofen (ADVIL,MOTRIN) 200 MG tablet Take 200 mg by mouth every 6 (six) hours as needed for pain. As needed    . omeprazole (PRILOSEC) 20 MG capsule Take 1 capsule (20 mg total) by mouth daily. 14 capsule 0  . vitamin C (ASCORBIC ACID) 500 MG tablet Take 500 mg by mouth daily.     No current facility-administered medications for this visit.     Allergies:   Macrobid [nitrofurantoin monohyd macro]    Social History:  The patient  reports that she has never smoked. She has never used smokeless tobacco. She reports that she drinks alcohol. She reports that she does not use drugs.   Family History:  The patient's family history includes GER disease in her mother; Heart disease in her father; Hyperlipidemia in her mother; Hypertension in her father and mother; Other in her sister.    ROS:  Please see the history of present illness.   Otherwise, review of systems are positive for none.   All other systems are reviewed and negative.    PHYSICAL EXAM: VS:  BP 124/82 (BP Location: Left Arm)   Pulse 93   Ht 5\' 6"  (1.676 m)   Wt 263 lb (119.3 kg)   SpO2 98%   BMI 42.45 kg/m  , BMI Body mass  index is 42.45 kg/m. Affect appropriate Overweight white female HEENT: normal Neck supple with no adenopathy JVP normal no bruits no thyromegaly Lungs clear with no wheezing and good diaphragmatic motion Heart:  S1/S2 no murmur, no rub, gallop or click PMI normal Abdomen: benighn, BS positve, no tenderness, no AAA no bruit.  No HSM or HJR Distal pulses intact with no bruits Plus one bilateral edema with varicosities  Neuro non-focal Skin warm and dry No muscular weakness    EKG:  ST rate 121 low precordial voltage 09/03/16    Recent Labs: 09/02/2016: Hemoglobin 14.6; Platelets 251 09/05/2016: ALT 25; BUN 9; Creatinine, Ser 0.81; Potassium 4.0; Sodium 140; TSH 3.520     Lipid Panel    Component Value Date/Time   CHOL 238 (H) 09/05/2016 0857   TRIG 152 (H) 09/05/2016 0857   HDL 43 09/05/2016 0857   CHOLHDL 5.5 (H) 09/05/2016 0857   CHOLHDL 5.2 12/20/2012 0933   VLDL 19 12/20/2012 0933   LDLCALC 165 (H) 09/05/2016 0857      Wt Readings from Last 3 Encounters:  09/18/16 263 lb (119.3 kg)  09/04/16 264 lb (119.7 kg)  09/02/16 255 lb (115.7 kg)      Other studies Reviewed: Additional studies/ records that were reviewed today include: ER notes 09/02/16 labs CXR Head CT ECG notes primary .    ASSESSMENT AND PLAN:  1.  Chest Pain atypical r/o f/u exercise myovue  2. Elevated Lipids see above also recommended coronary calcium score If greater than 50th percentile for age And sex would definitely Rx with statin f/u with primary  3. Anxiety improved  4. Tinnitus  Improved ? Related to dental procedure 5. HTN borderline low sodium diet weight loss and lifestyle changes f/u primary    Current medicines are reviewed at length with the patient today.  The patient does not have concerns regarding medicines.  The following changes have been made:  no change  Labs/ tests ordered today include: Calcium score and Ex Myovue    Orders Placed This Encounter  Procedures  . CT CARDIAC SCORING  . NM Myocar Multi W/Spect W/Wall Motion / EF     Disposition:   FU with cardiology PRN      Signed, Jenkins Rouge, MD  09/18/2016 11:17 AM    [facility] [address], [place], [place]  [address] Phone: [phone]; Fax: [phone]

## 2016-09-18 ENCOUNTER — Ambulatory Visit (INDEPENDENT_AMBULATORY_CARE_PROVIDER_SITE_OTHER): Payer: 59 | Admitting: Cardiovascular Disease

## 2016-09-18 ENCOUNTER — Encounter: Payer: Self-pay | Admitting: Cardiovascular Disease

## 2016-09-18 VITALS — BP 124/82 | HR 93 | Ht 66.0 in | Wt 263.0 lb

## 2016-09-18 DIAGNOSIS — R9431 Abnormal electrocardiogram [ECG] [EKG]: Secondary | ICD-10-CM | POA: Diagnosis not present

## 2016-09-18 DIAGNOSIS — I25119 Atherosclerotic heart disease of native coronary artery with unspecified angina pectoris: Secondary | ICD-10-CM | POA: Diagnosis not present

## 2016-09-18 NOTE — Patient Instructions (Signed)
Medication Instructions:  Your physician recommends that you continue on your current medications as directed. Please refer to the Current Medication list given to you today.   Labwork: None  Testing/Procedures: Cardiac Calcium Scoring   Your physician has requested that you have en exercise stress myoview. For further information please visit [email or web address]. Please follow instruction sheet, as given.    Follow-Up: Your physician recommends that you schedule a follow-up appointment in: To be determined (We will call with results)    Any Other Special Instructions Will Be Listed Below (If Applicable).     If you need a refill on your cardiac medications before your next appointment, please call your pharmacy.

## 2016-09-19 ENCOUNTER — Ambulatory Visit (INDEPENDENT_AMBULATORY_CARE_PROVIDER_SITE_OTHER)
Admission: RE | Admit: 2016-09-19 | Discharge: 2016-09-19 | Disposition: A | Discharge status: Home / Self Care | Payer: Self-pay | Source: Ambulatory Visit | Attending: Cardiovascular Disease | Admitting: Cardiovascular Disease

## 2016-09-19 DIAGNOSIS — I25119 Atherosclerotic heart disease of native coronary artery with unspecified angina pectoris: Secondary | ICD-10-CM

## 2016-09-25 ENCOUNTER — Ambulatory Visit: Payer: 59 | Admitting: Adult Health

## 2016-09-26 ENCOUNTER — Encounter (HOSPITAL_COMMUNITY)
Admission: RE | Admit: 2016-09-26 | Discharge: 2016-09-26 | Disposition: A | Discharge status: Home / Self Care | Payer: 59 | Source: Ambulatory Visit | Attending: Cardiovascular Disease | Admitting: Cardiovascular Disease

## 2016-09-26 ENCOUNTER — Encounter (HOSPITAL_COMMUNITY): Payer: Self-pay

## 2016-09-26 ENCOUNTER — Encounter (HOSPITAL_BASED_OUTPATIENT_CLINIC_OR_DEPARTMENT_OTHER)
Admission: RE | Admit: 2016-09-26 | Discharge: 2016-09-26 | Disposition: A | Discharge status: Home / Self Care | Payer: 59 | Source: Ambulatory Visit | Attending: Cardiovascular Disease | Admitting: Cardiovascular Disease

## 2016-09-26 DIAGNOSIS — R9431 Abnormal electrocardiogram [ECG] [EKG]: Secondary | ICD-10-CM | POA: Diagnosis not present

## 2016-09-26 LAB — NM MYOCAR MULTI W/SPECT W/WALL MOTION / EF
CHL CUP NUCLEAR SDS: 0
CSEPPHR: 169 {beats}/min
Estimated workload: 7 METS
Exercise duration (min): 5 min
Exercise duration (sec): 0 s
LHR: 0.3
LVDIAVOL: 53 mL (ref 46–106)
LVSYSVOL: 15 mL
MPHR: 163 {beats}/min
NUC STRESS TID: 1.07
Percent HR: 103 %
RPE: 17
Rest HR: 99 {beats}/min
SRS: 2
SSS: 2

## 2016-09-26 MED ORDER — REGADENOSON 0.4 MG/5ML IV SOLN
INTRAVENOUS | Status: AC
  Filled 2016-09-26: qty 5

## 2016-09-26 MED ORDER — TECHNETIUM TC 99M TETROFOSMIN IV KIT
10.0000 | PACK | Freq: Once | INTRAVENOUS | Status: AC | PRN
  Administered 2016-09-26: 10 via INTRAVENOUS

## 2016-09-26 MED ORDER — SODIUM CHLORIDE 0.9% FLUSH
INTRAVENOUS | Status: AC
  Administered 2016-09-26: 10 mL via INTRAVENOUS
  Filled 2016-09-26: qty 10

## 2016-09-26 MED ORDER — TECHNETIUM TC 99M TETROFOSMIN IV KIT
30.0000 | PACK | Freq: Once | INTRAVENOUS | Status: AC | PRN
  Administered 2016-09-26: 30 via INTRAVENOUS

## 2016-10-10 MED FILL — CLIMARA PRO PATCH: 0.045-0.015 | 28 days supply | Qty: 4 | Fill #9

## 2016-11-06 MED FILL — CLIMARA PRO PATCH: 0.045-0.015 | 28 days supply | Qty: 4 | Fill #10

## 2016-11-24 ENCOUNTER — Telehealth: Payer: Self-pay | Admitting: Adult Health

## 2016-11-24 MED ORDER — SULFAMETHOXAZOLE-TRIMETHOPRIM 800-160 MG PO TABS
1.0000 | ORAL_TABLET | Freq: Two times a day (BID) | ORAL | 0 refills | Status: DC
Start: 2016-11-24 — End: 2016-11-27

## 2016-11-24 NOTE — Telephone Encounter (Signed)
Pt has had cold and cough and has been leaking urine when coughs and now feels like UTI, wile rx septra ds,

## 2016-11-27 ENCOUNTER — Encounter: Payer: Self-pay | Admitting: Adult Health

## 2016-11-27 ENCOUNTER — Ambulatory Visit (INDEPENDENT_AMBULATORY_CARE_PROVIDER_SITE_OTHER): Payer: 59 | Admitting: Adult Health

## 2016-11-27 VITALS — BP 120/80 | HR 86 | Temp 98.5°F | Ht 66.0 in | Wt 263.2 lb

## 2016-11-27 DIAGNOSIS — N390 Urinary tract infection, site not specified: Secondary | ICD-10-CM

## 2016-11-27 DIAGNOSIS — R319 Hematuria, unspecified: Secondary | ICD-10-CM

## 2016-11-27 LAB — POCT URINALYSIS DIPSTICK
GLUCOSE UA: NEGATIVE
Nitrite, UA: NEGATIVE

## 2016-11-27 MED ORDER — FLUCONAZOLE 150 MG PO TABS
ORAL_TABLET | ORAL | 1 refills | Status: DC
Start: 2016-11-27 — End: 2017-02-02

## 2016-11-27 MED ORDER — CEPHALEXIN 500 MG PO CAPS: 500.0000 mg | ORAL_CAPSULE | Freq: Four times a day (QID) | ORAL | 0 refills | Status: DC

## 2016-11-27 NOTE — Progress Notes (Signed)
Subjective:     Patient ID: Amanda Higgins, female   DOB: Jul 18, 1960, 56 y.o.   MRN: [id number]  HPI Amanda Higgins is a 56 year old white female, married in complaining of burning with urination and pain in bladder area.   Review of Systems Burning with urination, has been taking septra ds +pain in bladder area  Reviewed past medical,surgical, social and family history. Reviewed medications and allergies.     Objective:   Physical Exam BP 120/80 (BP Location: Right Arm, Patient Position: Sitting, Cuff Size: Large)   Pulse 86   Temp 98.5 F (36.9 C)   Ht 5\' 6"  (1.676 m)   Wt 263 lb 3.2 oz (119.4 kg)   BMI 42.48 kg/m urine 2+blood,trace protein and 2+ WBC, Skin warm and dry. No CVAT.    Assessment:     1. Urinary tract infection with hematuria, site unspecified       Plan:     Meds ordered this encounter  Medications  . cephALEXin (KEFLEX) 500 MG capsule    Sig: Take 1 capsule (500 mg total) by mouth 4 (four) times daily.    Dispense:  28 capsule    Refill:  0    Order Specific Question:   Supervising Provider    Answer:   EURE, LUTHER H [[id number]]  . fluconazole (DIFLUCAN) 150 MG tablet    Sig: Take 1 now and repeat 1 in 3 days    Dispense:  2 tablet    Refill:  1    Order Specific Question:   Supervising Provider    Answer:   Tania Ade H [[id number]]  Can stop septra ds UA C&S sent Push fluids

## 2016-11-28 LAB — URINALYSIS, ROUTINE W REFLEX MICROSCOPIC
Bilirubin, UA: NEGATIVE
GLUCOSE, UA: NEGATIVE
Ketones, UA: NEGATIVE
Nitrite, UA: NEGATIVE
Specific Gravity, UA: 1.009 (ref 1.005–1.030)
UUROB: 0.2 mg/dL (ref 0.2–1.0)
pH, UA: 6 (ref 5.0–7.5)

## 2016-11-28 LAB — MICROSCOPIC EXAMINATION: Casts: NONE SEEN /lpf

## 2016-11-29 ENCOUNTER — Encounter: Payer: Self-pay | Admitting: Adult Health

## 2016-11-29 ENCOUNTER — Telehealth: Payer: Self-pay | Admitting: Adult Health

## 2016-11-29 DIAGNOSIS — N390 Urinary tract infection, site not specified: Secondary | ICD-10-CM

## 2016-11-29 DIAGNOSIS — R319 Hematuria, unspecified: Principal | ICD-10-CM

## 2016-11-29 HISTORY — DX: Urinary tract infection, site not specified: N39.0

## 2016-11-29 LAB — URINE CULTURE

## 2016-11-29 NOTE — Telephone Encounter (Signed)
Left message that urine +E coli, Keflex should take care of it,

## 2016-12-04 MED FILL — CLIMARA PRO PATCH: 0.045-0.015 | 28 days supply | Qty: 4 | Fill #11

## 2017-01-01 MED FILL — CLIMARA PRO PATCH: 0.045-0.015 | 28 days supply | Qty: 4 | Fill #12

## 2017-01-02 ENCOUNTER — Other Ambulatory Visit: Payer: Self-pay | Admitting: Adult Health

## 2017-01-02 MED ORDER — VALACYCLOVIR HCL 1 G PO TABS: ORAL_TABLET | ORAL | 1 refills | Status: DC

## 2017-01-02 NOTE — Progress Notes (Signed)
Pt has cold sore, will rx valtrex

## 2017-02-02 ENCOUNTER — Ambulatory Visit (INDEPENDENT_AMBULATORY_CARE_PROVIDER_SITE_OTHER): Payer: 59 | Admitting: Adult Health

## 2017-02-02 ENCOUNTER — Encounter: Payer: Self-pay | Admitting: Adult Health

## 2017-02-02 VITALS — BP 122/80 | HR 78 | Ht 65.5 in | Wt 261.5 lb

## 2017-02-02 DIAGNOSIS — Z1212 Encounter for screening for malignant neoplasm of rectum: Secondary | ICD-10-CM

## 2017-02-02 DIAGNOSIS — Z01419 Encounter for gynecological examination (general) (routine) without abnormal findings: Secondary | ICD-10-CM

## 2017-02-02 DIAGNOSIS — Z79890 Hormone replacement therapy (postmenopausal): Secondary | ICD-10-CM | POA: Diagnosis not present

## 2017-02-02 DIAGNOSIS — Z1211 Encounter for screening for malignant neoplasm of colon: Secondary | ICD-10-CM | POA: Diagnosis not present

## 2017-02-02 HISTORY — DX: Encounter for gynecological examination (general) (routine) without abnormal findings: Z01.419

## 2017-02-02 HISTORY — DX: Hormone replacement therapy: Z79.890

## 2017-02-02 LAB — HEMOCCULT GUIAC POC 1CARD (OFFICE): Fecal Occult Blood, POC: NEGATIVE

## 2017-02-02 MED ORDER — ESTRADIOL-LEVONORGESTREL 0.045-0.015 MG/DAY TD PTWK: 1.0000 | MEDICATED_PATCH | TRANSDERMAL | 99 refills | Status: DC

## 2017-02-02 MED FILL — CLIMARA PRO PATCH: 0.045-0.015 | 28 days supply | Qty: 4 | Fill #0

## 2017-02-02 NOTE — Progress Notes (Signed)
Patient ID: Amanda Higgins, female   DOB: [birth year], 56 y.o.   MRN: [id number] History of Present Illness: Amanda Higgins is a 56 year old white female, married, PM in for well woman gyn exam, she had normal pap with negative HPV 09/24/15. She needs refills on Climara Pro patches.    Current Medications, Allergies, Past Medical History, Past Surgical History, Family History and Social History were reviewed in [facility] record.     Review of Systems: Patient denies any headaches, hearing loss, fatigue, blurred vision, shortness of breath, chest pain, abdominal pain, problems with bowel movements, urination, or intercourse. No joint pain or mood swings.Has had head cold    Physical Exam:BP 122/80 (BP Location: Left Arm, Patient Position: Sitting, Cuff Size: Large)   Pulse 78   Ht 5' 5.5" (1.664 m)   Wt 261 lb 8 oz (118.6 kg)   BMI 42.85 kg/m  General:  Well developed, well nourished, no acute distress Skin:  Warm and dry Neck:  Midline trachea, normal thyroid, good ROM, no lymphadenopathy Lungs; Clear to auscultation bilaterally Breast:  No dominant palpable mass, retraction, or nipple discharge Cardiovascular: Regular rate and rhythm Abdomen:  Soft, non tender, no hepatosplenomegaly Pelvic:  External genitalia is normal in appearance, no lesions.  The vagina is normal in appearance. Urethra has no lesions or masses. The cervix is bulbous.  Uterus is felt to be normal size, shape, and contour.  No adnexal masses or tenderness noted.Bladder is non tender, no masses felt. Rectal: Good sphincter tone, no polyps, or hemorrhoids felt.  Hemoccult negative. Extremities/musculoskeletal:  No swelling or varicosities noted, no clubbing or cyanosis Psych:  No mood changes, alert and cooperative,seems happy PHQ 2 score 0.  Impression: 1. Encounter for well woman exam with routine gynecological exam   2. Screening for colorectal cancer   3. Hormone replacement therapy (HRT)        Plan: Meds ordered this encounter  Medications  . estradiol-levonorgestrel (CLIMARA PRO) 0.045-0.015 MG/DAY    Sig: Place 1 patch onto the skin once a week.    Dispense:  12 patch    Refill:  prn    Order Specific Question:   Supervising Provider    Answer:   Florian Buff [[id number]]   Physical in 1 year Pap in 2020 Mammogram yearly Colonoscopy per GI

## 2017-02-05 ENCOUNTER — Telehealth: Payer: Self-pay | Admitting: Adult Health

## 2017-02-05 MED ORDER — AZITHROMYCIN 250 MG PO TABS: ORAL_TABLET | ORAL | 0 refills | Status: DC

## 2017-02-05 NOTE — Telephone Encounter (Signed)
URI with sore throat worse instead of better will rx Z pack.

## 2017-03-05 MED FILL — CLIMARA PRO PATCH: 0.045-0.015 | 28 days supply | Qty: 4 | Fill #1

## 2017-04-02 MED FILL — CLIMARA PRO PATCH: 0.045-0.015 | 28 days supply | Qty: 4 | Fill #2

## 2017-04-20 ENCOUNTER — Telehealth: Payer: Self-pay | Admitting: Adult Health

## 2017-04-20 MED ORDER — OSELTAMIVIR PHOSPHATE 75 MG PO CAPS: 75.0000 mg | ORAL_CAPSULE | Freq: Two times a day (BID) | ORAL | 0 refills | Status: DC

## 2017-04-20 NOTE — Telephone Encounter (Signed)
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

## 2017-04-30 MED FILL — CLIMARA PRO PATCH: 0.045-0.015 | 28 days supply | Qty: 4 | Fill #3

## 2017-05-24 MED FILL — CLIMARA PRO PATCH: 0.045-0.015 | 28 days supply | Qty: 4 | Fill #4

## 2017-06-08 ENCOUNTER — Other Ambulatory Visit: Payer: Self-pay | Admitting: Adult Health

## 2017-06-08 DIAGNOSIS — Z1231 Encounter for screening mammogram for malignant neoplasm of breast: Secondary | ICD-10-CM

## 2017-06-14 ENCOUNTER — Encounter (HOSPITAL_COMMUNITY): Payer: Self-pay

## 2017-06-14 ENCOUNTER — Ambulatory Visit (HOSPITAL_COMMUNITY)
Admission: RE | Admit: 2017-06-14 | Discharge: 2017-06-14 | Disposition: A | Discharge status: Home / Self Care | Payer: 59 | Source: Ambulatory Visit | Attending: Adult Health | Admitting: Adult Health

## 2017-06-14 DIAGNOSIS — Z1231 Encounter for screening mammogram for malignant neoplasm of breast: Secondary | ICD-10-CM | POA: Insufficient documentation

## 2017-07-04 MED FILL — CLIMARA PRO PATCH: 0.045-0.015 | 28 days supply | Qty: 4 | Fill #5

## 2017-07-30 MED FILL — CLIMARA PRO PATCH: 0.045-0.015 | 28 days supply | Qty: 4 | Fill #6

## 2017-09-03 MED FILL — CLIMARA PRO PATCH: 0.045-0.015 | 28 days supply | Qty: 4 | Fill #7

## 2017-10-01 MED FILL — CLIMARA PRO PATCH: 0.045-0.015 | 28 days supply | Qty: 4 | Fill #8

## 2017-11-05 MED FILL — CLIMARA PRO PATCH: 0.045-0.015 | 28 days supply | Qty: 4 | Fill #9

## 2017-12-03 MED FILL — CLIMARA PRO PATCH: 0.045-0.015 | 28 days supply | Qty: 4 | Fill #10

## 2017-12-25 ENCOUNTER — Telehealth: Payer: Self-pay | Admitting: Adult Health

## 2017-12-25 MED ORDER — CEPHALEXIN 500 MG PO CAPS: 500.0000 mg | ORAL_CAPSULE | Freq: Four times a day (QID) | ORAL | 0 refills | Status: DC

## 2017-12-25 NOTE — Telephone Encounter (Signed)
Pt has UTI will rx keflex

## 2018-01-07 ENCOUNTER — Encounter: Payer: Self-pay | Admitting: Adult Health

## 2018-01-07 ENCOUNTER — Ambulatory Visit (INDEPENDENT_AMBULATORY_CARE_PROVIDER_SITE_OTHER): Payer: 59 | Admitting: Adult Health

## 2018-01-07 VITALS — BP 114/78 | HR 72 | Ht 66.0 in | Wt 222.5 lb

## 2018-01-07 DIAGNOSIS — R319 Hematuria, unspecified: Secondary | ICD-10-CM

## 2018-01-07 DIAGNOSIS — R300 Dysuria: Secondary | ICD-10-CM

## 2018-01-07 LAB — POCT URINALYSIS DIPSTICK OB
GLUCOSE, UA: NEGATIVE
Ketones, UA: NEGATIVE
Nitrite, UA: NEGATIVE
POC,PROTEIN,UA: NEGATIVE

## 2018-01-07 MED FILL — CLIMARA PRO PATCH: 0.045-0.015 | 28 days supply | Qty: 4 | Fill #11

## 2018-01-07 NOTE — Progress Notes (Signed)
  Subjective:     Patient ID: Amanda Higgins, female   DOB: Mar 26, 1960, 57 y.o.   MRN: [id number]  HPI Amanda Higgins is a 57 year old white female, married, PM in complaining of urinary frequency and urgency with burning, just finished kelfex. Seems to happen after URI with coughing and urine leakage and wearing pads.   Review of Systems Urinary frequency,urgency and burning Just finished keflex  Reviewed past medical,surgical, social and family history. Reviewed medications and allergies.     Objective:   Physical Exam BP 114/78 (BP Location: Left Arm, Patient Position: Sitting, Cuff Size: Large)   Pulse 72   Ht 5\' 6"  (1.676 m)   Wt 222 lb 8 oz (100.9 kg)   BMI 35.91 kg/m  urine dipstick 3+leuks and 2+blood. Skin warm and dry, no CVAT. Fall risk low.    Assessment:     1. Hematuria, unspecified type   2. Burning with urination       Plan:     Push fluids UA C&S sent F/U prn

## 2018-01-08 LAB — URINALYSIS, ROUTINE W REFLEX MICROSCOPIC
Bilirubin, UA: NEGATIVE
GLUCOSE, UA: NEGATIVE
Ketones, UA: NEGATIVE
Nitrite, UA: NEGATIVE
PH UA: 6.5 (ref 5.0–7.5)
PROTEIN UA: NEGATIVE
Specific Gravity, UA: <=1.005 — AB (ref 1.005–1.030)
UUROB: 0.2 mg/dL (ref 0.2–1.0)

## 2018-01-08 LAB — MICROSCOPIC EXAMINATION: Casts: NONE SEEN /lpf

## 2018-01-09 ENCOUNTER — Telehealth: Payer: Self-pay | Admitting: Adult Health

## 2018-01-09 LAB — URINE CULTURE

## 2018-01-09 MED ORDER — CIPROFLOXACIN HCL 500 MG PO TABS: ORAL_TABLET | ORAL | 0 refills | Status: DC

## 2018-01-09 NOTE — Telephone Encounter (Signed)
Pt aware urine + E coli, will rx cipro 500 mg 1 bid x 7 days

## 2018-02-04 ENCOUNTER — Other Ambulatory Visit: Payer: Self-pay | Admitting: Adult Health

## 2018-02-04 MED FILL — CLIMARA PRO PATCH: 0.045-0.015 | 84 days supply | Qty: 12 | Fill #0

## 2018-03-22 DIAGNOSIS — R21 Rash and other nonspecific skin eruption: Secondary | ICD-10-CM | POA: Insufficient documentation

## 2018-03-28 ENCOUNTER — Ambulatory Visit (INDEPENDENT_AMBULATORY_CARE_PROVIDER_SITE_OTHER): Payer: 59 | Admitting: Family Medicine

## 2018-03-28 ENCOUNTER — Encounter: Payer: Self-pay | Admitting: Family Medicine

## 2018-03-28 VITALS — BP 126/83 | HR 76 | Resp 12 | Ht 66.0 in | Wt 197.0 lb

## 2018-03-28 DIAGNOSIS — E669 Obesity, unspecified: Secondary | ICD-10-CM

## 2018-03-28 DIAGNOSIS — R21 Rash and other nonspecific skin eruption: Secondary | ICD-10-CM

## 2018-03-28 DIAGNOSIS — E782 Mixed hyperlipidemia: Secondary | ICD-10-CM | POA: Diagnosis not present

## 2018-03-28 DIAGNOSIS — Z114 Encounter for screening for human immunodeficiency virus [HIV]: Secondary | ICD-10-CM

## 2018-03-28 DIAGNOSIS — Z1159 Encounter for screening for other viral diseases: Secondary | ICD-10-CM

## 2018-03-28 NOTE — Progress Notes (Signed)
New Patient Office Visit  Subjective:  Patient ID: Amanda Higgins, female    DOB: 21-Apr-1960  Age: 58 y.o. MRN: [id number]  CC:  Chief Complaint  Patient presents with  . establish care  . rash abdomen    HPI Amanda Higgins is a 58 year old female, new to the office.  Has a been seen by family Sedalia Muta NP for GYN purposes over the years.  Would like to establish with a PCP for ongoing care.  Pertinent history includes hyperlipidemia, frequent UTIs, post menopausal hormone replacement therapy, obesity, and gallbladder removal.  Is currently taking vitamin D 5000 units daily, is on the Climara estrogen patch weekly, takes vitamin C 1000 mg daily, and Motrin as needed.  Of note the estrogen patch she has been on for several years now, is going to be going back to her GYN in a month, is going to ask about when she should be stopping that.  She does not have a family history of having breast cancer, however her sister had pre-cancerous lesions removed twice.  Additionally she reports that she has had a history of postmenopausal bleeding close to the time where she was put on the patches.  Does not report having this occur often, or if it does it is very few spotting.  Does report having some incontinence, stress related.  Does wear a pad, this is what she believes leads her to having frequent urinary tract infections.  Health maintenance: Colonoscopy due in 2024, mammogram due in 2021 (patient scheduled), unsure of last tetanus (will talk to health at work and let us know), does not recall ever being tested for hepatitis C screening or HIV screening (will add these to labs today).  Is followed actively by dentist and eye doctor.  Last dentist visit was last month, last eye care appointment was in December, she does wear glasses.  Elevated cholesterol with elevated triglycerides: Last lipid profile was drawn in July 2018, ratio of cholesterol to HDL was 5.5, cholesterol was 238, HDL was 43,  LDL was 165, triglycerides were 152.  Most recently reports that she joined weight watchers last May 2019, starting weight was 264 pounds at that time.  Today in office she is 197 pounds.  Her goal is 175 pounds she walks 2 to 3 days a week.  For about 30 minutes at a time.  She believes she is able to make good health choices as far as diet goes.  Weight watchers has given her a lot of information.  BMI: 31.80, this has decreased since being seen even in December 2019 when BMI was 35.93.  Is actively participating in weight watchers.  Does walk.  But is not as focused on exercise as she is on diet control.  Socially: Currently works in the [employer], as a regulator of quality and patient safety. Lives with husband of 9 years. They have a blended family of 4 children.  She has 2 daughters, he has a daughter and a son.  They enjoy traveling, have been all over the [place], [place], planning trip for [place] coming up, and [place] may be next year.  Reports wearing her seatbelt and wearing sunscreen.  Reports no smoking, is an occasional alcohol drinker with 1 glass of wine when going out with friends.  Overall, does not have any complaints today in the office.  Did have something come up over the weekend prior to coming in.  A rash that started Friday night  that she saw after getting out of the shower.  There is no pain, no itching, no spreading, no fevers, no chills.  Has not changed any of her daily products for laundry and or body hygiene care.  Is unsure if this was related to increase in stress that she recently had at work.  She did have some steroid cream at home that she was using that was her husband's.  BetaMethasone 0.05%.  She has not noticed an improvement of this and she has been using it now for about a week.   Past Medical History:  Diagnosis Date  . Elevated cholesterol with elevated triglycerides 10/01/2015  . Hematuria 12/11/2012  . Hx: UTI (urinary tract infection)     . Left shoulder pain    Nerve impingement  . Obesity   . PMB (postmenopausal bleeding) 09/24/2015   Usually at time of changing patches   . Postmenopausal HRT (hormone replacement therapy) 12/17/2012   On combi patch  . Urinary frequency 07/11/2013  . Urinary pain 12/11/2012  . UTI (urinary tract infection) 11/29/2016   +Ecoli    Past Surgical History:  Procedure Laterality Date  . COLONOSCOPY WITH ESOPHAGOGASTRODUODENOSCOPY (EGD) N/A 10/18/2012   Procedure: COLONOSCOPY WITH ESOPHAGOGASTRODUODENOSCOPY (EGD);  Surgeon: Daneil Dolin, MD;  Location: [facility];  Service: Endoscopy;  Laterality: N/A;  9:45 AM  . LAPAROSCOPIC CHOLECYSTECTOMY      Family History  Problem Relation Age of Onset  . Hyperlipidemia Mother   . Hypertension Mother   . GER disease Mother   . Heart disease Father        atrial fib  . Hypertension Father   . Arthritis Father        RA  . Heart disease Paternal Grandmother        CHF  . Hyperlipidemia Maternal Grandmother   . Kidney disease Brother        kidney stones  . Other Sister        precancerous breast lesion  . Kidney disease Brother        kidney stones  . Colon cancer Neg Hx     Social History   Socioeconomic History  . Marital status: Married    Spouse name: Jeneen Rinks   . Number of children: 2  . Years of education: 16  . Highest education level: Bachelor's degree (e.g., BA, AB, BS)  Occupational History  . Not on file  Social Needs  . Financial resource strain: Not hard at all  . Food insecurity:    Worry: Never true    Inability: Never true  . Transportation needs:    Medical: No    Non-medical: No  Tobacco Use  . Smoking status: Never Smoker  . Smokeless tobacco: Never Used  Substance and Sexual Activity  . Alcohol use: Yes    Comment: occasional wine  . Drug use: No  . Sexual activity: Yes    Birth control/protection: Post-menopausal  Lifestyle  . Physical activity:    Days per week: 3 days    Minutes per session:  30 min  . Stress: Not at all  Relationships  . Social connections:    Talks on phone: More than three times a week    Gets together: More than three times a week    Attends religious service: More than 4 times per year    Active member of club or organization: Yes    Attends meetings of clubs or organizations: More than 4 times per  year    Relationship status: Married  . Intimate partner violence:    Fear of current or ex partner: No    Emotionally abused: No    Physically abused: No    Forced sexual activity: No  Other Topics Concern  . Not on file  Social History Narrative   Lives with husband, Jeneen Rinks, married 9 years.      Blended family: each had two.   Two daughters   He had a daughter and son      Enjoys traveling: within [place], [place], [place]       Sunscreen and seat belt wearer   Well-balanced diet   Drinks water- does enjoy diet coke      Does take vitamin D and C          ROS Review of Systems  Constitutional: Negative for activity change, appetite change, chills, fatigue and fever.  HENT: Negative for congestion, ear pain, rhinorrhea, sinus pressure, sinus pain and sore throat.   Eyes: Negative.  Negative for visual disturbance.  Respiratory: Negative for cough, chest tightness, shortness of breath and wheezing.   Cardiovascular: Negative for chest pain, palpitations and leg swelling.  Gastrointestinal: Negative for blood in stool, constipation and diarrhea.  Endocrine: Negative for cold intolerance, heat intolerance, polydipsia, polyphagia and polyuria.  Genitourinary: Negative for difficulty urinating, dyspareunia, dysuria, hematuria, vaginal bleeding, vaginal discharge and vaginal pain.  Musculoskeletal: Negative.   Skin: Positive for rash. Negative for color change and wound.  Allergic/Immunologic: Negative for environmental allergies.  Neurological: Negative for dizziness and headaches.  Hematological: Negative for adenopathy. Does not bruise/bleed  easily.  Psychiatric/Behavioral: Negative for confusion, decreased concentration and sleep disturbance. The patient is not nervous/anxious.   All other systems reviewed and are negative.   Objective:   Today's Vitals: BP 126/83   Pulse 76   Resp 12   Ht 5\' 6"  (1.676 m)   Wt 197 lb (89.4 kg)   SpO2 99%   BMI 31.80 kg/m   Physical Exam Vitals signs and nursing note reviewed.  Constitutional:      Appearance: Normal appearance. She is well-developed. She is obese.  HENT:     Head: Normocephalic.     Right Ear: Tympanic membrane, ear canal and external ear normal.     Left Ear: Tympanic membrane, ear canal and external ear normal.     Nose: Nose normal.     Mouth/Throat:     Mouth: Mucous membranes are moist.     Pharynx: Oropharynx is clear.  Eyes:     Conjunctiva/sclera: Conjunctivae normal.  Neck:     Musculoskeletal: Normal range of motion and neck supple.  Cardiovascular:     Rate and Rhythm: Normal rate and regular rhythm.     Pulses: Normal pulses.     Heart sounds: Normal heart sounds.  Pulmonary:     Effort: Pulmonary effort is normal.     Breath sounds: Normal breath sounds.  Abdominal:     General: Bowel sounds are normal.     Palpations: Abdomen is soft.  Musculoskeletal: Normal range of motion.  Skin:    General: Skin is warm and dry.     Findings: Rash present. Rash is macular and papular. Rash is not crusting, pustular, scaling, urticarial or vesicular.     Comments: Multiple macular papular, red and pink spots located on trunk area abdomen, backside. Some spots have drying skin eruption in the center and appear to be healing over.  No vesicles  or pustular areas noted.  Not tender to touch nor itchy.  Neurological:     Mental Status: She is alert and oriented to person, place, and time.  Psychiatric:        Attention and Perception: Attention normal.        Mood and Affect: Mood normal.        Speech: Speech normal.        Behavior: Behavior normal.  Behavior is cooperative.        Thought Content: Thought content normal.        Cognition and Memory: Cognition normal.        Judgment: Judgment normal.     Comments: Very pleasant in conversation today.     Assessment & Plan:   1. Elevated cholesterol with elevated triglycerides She has been trying to actively lose weight now for about a year.  Last lipid profile was in 2018.  Will draw lipid panel today as she is fasting.  Currently not on any medication for elevated cholesterol.  Will await results and decide if any medication would be needed.  Strongly encouraged to keep up the good work with [employer] and walking.  Would like to see her walking up to 5 times a week for 30 minutes at a time for total of 150 minutes weekly.  - Lipid Profile - HgB A1c  2. Obesity (BMI 30.0-34.9) She is still considered in the obese range with her elevated BMI.  She has made great strides over the last year with her weight loss.  Is still actively engaged in losing weight.  Would like to be 175 pounds.  Has not had recent labs drawn since 2018.  Will collect a lipid panel, CBC, CMP with GFR, hemoglobin A1c, and TSH.  I suspect most of these to be improved from last drawl.  Secondary to her improvement in her lifestyle and weight.  She is encouraged to continue losing weight with weight watchers and walking.  And as stated would like to see her walking up to 5 times a week for 30 minutes at a time for total of 150 minutes weekly.  - Lipid Profile - CBC - COMPLETE METABOLIC PANEL WITH GFR - HgB A1c - TSH  3. Encounter for screening for HIV Has not had HIV screening task force recommendation will add that to labs today.  - HIV antibody (with reflex)  4. Encounter for hepatitis C screening test for low risk patient Has not had hepatitis C screening test force recommendation will add that today's labs.  - Hepatitis C Antibody  5. Rash  Presented today with a questionable rash of unknown origin.   Rash is located on the torso trunk abdomen around the back predominantly between bra strap and hip area where her pants sit.  Rash has an appearance of a maculopapular nature, oblong circular red to pink in nature.  Does not appear scaly or of of ring nature.  Several are drying out and skin is peeling back and normal skin is underneath.  Question if this was a stress reaction due to having increased stress over the last week or so that she is not experienced as much in the past.  She has tried betamethasone cream without much resolve.  Would consider if this has not cleared in the next week a fungal treatment.  She is okay with waiting to see if it was just stress related at this time.  Advised her that if the symptoms continue or worsen  or spread that she would need to come back in.  So that we can treat her appropriately and make sure that the lesions did not change in nature.   Follow-up: Return in about 5 months (around 08/26/2018) for annual visit.  Since doing labs today in office, if labs are normal we can maintain an annual visit for the following year, she can come as needed.  [facility].  Will actually be seeing them in March of this year.  Perlie Mayo, NP

## 2018-03-28 NOTE — Patient Instructions (Signed)
    Thank you for coming into the office today. I appreciate the opportunity to provide you with the care for your health and wellness.  Please continue your weight loss journey. If I can help in anyway let me know.  Look at vitamin for calcium, if it does not have it, please get an OTC calcium 500 mg tablet.   It was a pleasure to see you and I look forward to continuing to work together on your health and well-being. Please do not hesitate to call the office if you need care or have questions about your care.  Have a wonderful day and week.  With Gratitude,  Cherly Beach, DNP, AGNP-BC     HEALTH MAINTENANCE RECOMMENDATIONS:  It is recommended that you get at least 30 minutes of aerobic exercise at least 5 days/week (for weight loss, you may need as much as 60-90 minutes). This can be any activity that gets your heart rate up. This can be divided in 10-15 minute intervals if needed, but try and build up your endurance at least once a week.  Weight bearing exercise is also recommended twice weekly.  Eat a healthy diet with lots of vegetables, fruits and fiber.  "Colorful" foods have a lot of vitamins (ie green vegetables, tomatoes, red peppers, etc).  Limit sweet tea, regular sodas and alcoholic beverages, all of which has a lot of calories and sugar.  Up to 1 alcoholic drink daily may be beneficial for women (unless trying to lose weight, watch sugars).  Drink a lot of water.  Calcium recommendations are 1200-1500 mg daily (1500 mg for postmenopausal women or women without ovaries), and vitamin D 1000 IU daily.  This should be obtained from diet and/or supplements (vitamins), and calcium should not be taken all at once, but in divided doses.  Monthly self breast exams and yearly mammograms for women over the age of 9 is recommended.  Sunscreen of at least SPF 30 should be used on all sun-exposed parts of the skin when outside between the hours of 10 am and 4 pm (not just when at beach  or pool, but even with exercise, golf, tennis, and yard work!)  Use a sunscreen that says "broad spectrum" so it covers both UVA and UVB rays, and make sure to reapply every 1-2 hours.  Remember to change the batteries in your smoke detectors when changing your clock times in the spring and fall.  Use your seat belt every time you are in a car, and please drive safely and not be distracted with cell phones and texting while driving.

## 2018-03-29 LAB — COMPLETE METABOLIC PANEL WITH GFR
AG RATIO: 1.4 (calc) (ref 1.0–2.5)
ALT: 16 U/L (ref 6–29)
AST: 15 U/L (ref 10–35)
Albumin: 4 g/dL (ref 3.6–5.1)
Alkaline phosphatase (APISO): 73 U/L (ref 37–153)
BUN: 14 mg/dL (ref 7–25)
CHLORIDE: 104 mmol/L (ref 98–110)
CO2: 27 mmol/L (ref 20–32)
Calcium: 8.9 mg/dL (ref 8.6–10.4)
Creat: 0.81 mg/dL (ref 0.50–1.05)
GFR, Est African American: 93 mL/min/{1.73_m2} (ref >=60)
GFR, Est Non African American: 80 mL/min/{1.73_m2} (ref >=60)
Globulin: 2.8 g/dL (calc) (ref 1.9–3.7)
Glucose, Bld: 106 mg/dL — ABNORMAL HIGH (ref 65–99)
POTASSIUM: 4.3 mmol/L (ref 3.5–5.3)
Sodium: 139 mmol/L (ref 135–146)
Total Bilirubin: 0.9 mg/dL (ref 0.2–1.2)
Total Protein: 6.8 g/dL (ref 6.1–8.1)

## 2018-03-29 LAB — LIPID PANEL
CHOL/HDL RATIO: 4.2 (calc) (ref <5.0)
Cholesterol: 167 mg/dL (ref <200)
HDL: 40 mg/dL — ABNORMAL LOW (ref >=50)
LDL CHOLESTEROL (CALC): 110 mg/dL — AB
Non-HDL Cholesterol (Calc): 127 mg/dL (calc) (ref <130)
TRIGLYCERIDES: 79 mg/dL (ref <150)

## 2018-03-29 LAB — HEPATITIS C ANTIBODY
Hepatitis C Ab: NONREACTIVE
SIGNAL TO CUT-OFF: 0.03 (ref <1.00)

## 2018-03-29 LAB — CBC
HCT: 42.6 % (ref 35.0–45.0)
Hemoglobin: 13.9 g/dL (ref 11.7–15.5)
MCH: 29.4 pg (ref 27.0–33.0)
MCHC: 32.6 g/dL (ref 32.0–36.0)
MCV: 90.1 fL (ref 80.0–100.0)
MPV: 10.1 fL (ref 7.5–12.5)
Platelets: 257 10*3/uL (ref 140–400)
RBC: 4.73 10*6/uL (ref 3.80–5.10)
RDW: 12.2 % (ref 11.0–15.0)
WBC: 7.1 10*3/uL (ref 3.8–10.8)

## 2018-03-29 LAB — TSH: TSH: 1.93 mIU/L (ref 0.40–4.50)

## 2018-03-29 LAB — HEMOGLOBIN A1C
Hgb A1c MFr Bld: 5.6 % of total Hgb (ref <5.7)
Mean Plasma Glucose: 114 (calc)
eAG (mmol/L): 6.3 (calc)

## 2018-03-29 LAB — HIV ANTIBODY (ROUTINE TESTING W REFLEX): HIV 1&2 Ab, 4th Generation: NONREACTIVE

## 2018-04-16 ENCOUNTER — Other Ambulatory Visit: Payer: 59 | Admitting: Adult Health

## 2018-04-17 ENCOUNTER — Other Ambulatory Visit: Payer: Self-pay

## 2018-04-17 ENCOUNTER — Ambulatory Visit (INDEPENDENT_AMBULATORY_CARE_PROVIDER_SITE_OTHER): Payer: 59 | Admitting: Adult Health

## 2018-04-17 ENCOUNTER — Other Ambulatory Visit (HOSPITAL_COMMUNITY)
Admission: RE | Admit: 2018-04-17 | Discharge: 2018-04-17 | Disposition: A | Discharge status: Home / Self Care | Payer: 59 | Source: Ambulatory Visit | Attending: Adult Health | Admitting: Adult Health

## 2018-04-17 ENCOUNTER — Encounter: Payer: Self-pay | Admitting: Adult Health

## 2018-04-17 VITALS — BP 134/86 | HR 68 | Ht 66.0 in | Wt 214.0 lb

## 2018-04-17 DIAGNOSIS — N950 Postmenopausal bleeding: Secondary | ICD-10-CM

## 2018-04-17 DIAGNOSIS — Z1211 Encounter for screening for malignant neoplasm of colon: Secondary | ICD-10-CM | POA: Insufficient documentation

## 2018-04-17 DIAGNOSIS — L42 Pityriasis rosea: Secondary | ICD-10-CM

## 2018-04-17 DIAGNOSIS — Z1212 Encounter for screening for malignant neoplasm of rectum: Secondary | ICD-10-CM

## 2018-04-17 DIAGNOSIS — Z79890 Hormone replacement therapy (postmenopausal): Secondary | ICD-10-CM

## 2018-04-17 DIAGNOSIS — Z01419 Encounter for gynecological examination (general) (routine) without abnormal findings: Secondary | ICD-10-CM | POA: Insufficient documentation

## 2018-04-17 HISTORY — DX: Encounter for gynecological examination (general) (routine) without abnormal findings: Z01.419

## 2018-04-17 HISTORY — DX: Encounter for screening for malignant neoplasm of colon: Z12.11

## 2018-04-17 HISTORY — DX: Pityriasis rosea: L42

## 2018-04-17 LAB — HEMOCCULT GUIAC POC 1CARD (OFFICE): Fecal Occult Blood, POC: NEGATIVE

## 2018-04-17 NOTE — Progress Notes (Signed)
Patient ID: Amanda Higgins, female   DOB: 1961-01-27, 58 y.o.   MRN: [id number] History of Present Illness: Amanda Higgins is a 58 year old white female, married, PM in for well woman gyn exam and pap.  She is a Therapist, sports and  works for [employer], and has been home with husband recently, he had back surgery 04/05/2018.  She recently established care with PCP.  PCP is Cherly Beach, NP.    Current Medications, Allergies, Past Medical History, Past Surgical History, Family History and Social History were reviewed in [employer] record.     Review of Systems:  Patient denies any headaches, hearing loss, fatigue, blurred vision, shortness of breath, chest pain, abdominal pain, problems with bowel movements,  or intercourse. No joint pain or mood swings. Some SUI,better since weight loss No GERD since weight loss  Has wiped brown discharge several times, she had PMB in 2017 with normal [place] then  Has rash for about a month Has lost 50 lbs since last May doing [employer].    Physical Exam:BP 134/86 (BP Location: Left Arm, Patient Position: Sitting, Cuff Size: Normal)   Pulse 68   Ht 5\' 6"  (1.676 m)   Wt 214 lb (97.1 kg)   BMI 34.54 kg/m  General:  Well developed, well nourished, no acute distress Skin:  Warm and dry,has salmon colored scaly rash over trunk for about a month now, no itching Neck:  Midline trachea, normal thyroid, good ROM, no lymphadenopathy Lungs; Clear to auscultation bilaterally Breast:  No dominant palpable mass, retraction, or nipple discharge Cardiovascular: Regular rate and rhythm Abdomen:  Soft, non tender, no hepatosplenomegaly Pelvic:  External genitalia is normal in appearance, no lesions.  The vagina is normal in appearance, +dark brown blood in vault and at os. Urethra has no lesions or masses. The cervix is bulbous,pap with HPV performed.  Uterus is felt to be normal size, shape, and contour.  No adnexal masses or tenderness noted.Bladder is non tender, no  masses felt. Rectal: Good sphincter tone, no polyps, or hemorrhoids felt.  Hemoccult negative. Extremities/musculoskeletal:  No swelling or varicosities noted, no clubbing or cyanosis Psych:  No mood changes, alert and cooperative,seems happy Fall risk is low. PHQ 9 score is 0. Examination chaperoned by Diona Fanti CMA.  Explained rash and showed her pictures, to try tanning bed.   Impression: 1. Encounter for gynecological examination with Papanicolaou smear of cervix   2. Screening for colorectal cancer   3. PMB (postmenopausal bleeding)   4. Hormone replacement therapy (HRT)   5. Pityriasis rosea       Plan: Return in 1 week for GYN [place] to assess spotting Continue HRT, has refills on patch Mammogram yearly Labs with PCP Physical in 1 year Pap in 3 years if normal Colonoscopy per GI

## 2018-04-19 LAB — CYTOLOGY - PAP
Diagnosis: NEGATIVE
HPV: NOT DETECTED

## 2018-04-25 ENCOUNTER — Ambulatory Visit (INDEPENDENT_AMBULATORY_CARE_PROVIDER_SITE_OTHER): Payer: 59

## 2018-04-25 ENCOUNTER — Other Ambulatory Visit: Payer: Self-pay

## 2018-04-25 DIAGNOSIS — N950 Postmenopausal bleeding: Secondary | ICD-10-CM | POA: Diagnosis not present

## 2018-04-25 NOTE — Progress Notes (Signed)
[facility] US TA/TV:homogeneous anteverted uterus,wnl,EEC 5.1 mm,normal ovaries bilat,limited view of left ovary,no free fluid,mult small nabothian cyst,no pain during ultrasound

## 2018-04-29 ENCOUNTER — Telehealth: Payer: Self-pay | Admitting: *Deleted

## 2018-04-29 ENCOUNTER — Telehealth: Payer: Self-pay | Admitting: Adult Health

## 2018-04-29 NOTE — Telephone Encounter (Signed)
Spoke with pt letting her know no visitors or children will be allowed at tomorrow's appt. Pt states she has not had any symptoms of Covid-19. [place]

## 2018-04-29 NOTE — Telephone Encounter (Signed)
Pt aware that US showed EEC 5.1 mm will get endometrial biopsy, she is on HRT and Dr Elonda Husky recommends the biopsy

## 2018-04-30 ENCOUNTER — Other Ambulatory Visit: Payer: Self-pay | Admitting: Obstetrics and Gynecology

## 2018-04-30 ENCOUNTER — Other Ambulatory Visit: Payer: Self-pay

## 2018-04-30 ENCOUNTER — Ambulatory Visit (INDEPENDENT_AMBULATORY_CARE_PROVIDER_SITE_OTHER): Payer: 59 | Admitting: Obstetrics and Gynecology

## 2018-04-30 ENCOUNTER — Encounter: Payer: Self-pay | Admitting: Obstetrics and Gynecology

## 2018-04-30 VITALS — BP 131/89 | HR 92 | Temp 98.6°F | Ht 66.0 in | Wt 212.6 lb

## 2018-04-30 DIAGNOSIS — Z3202 Encounter for pregnancy test, result negative: Secondary | ICD-10-CM | POA: Diagnosis not present

## 2018-04-30 DIAGNOSIS — N950 Postmenopausal bleeding: Secondary | ICD-10-CM | POA: Diagnosis not present

## 2018-04-30 DIAGNOSIS — N858 Other specified noninflammatory disorders of uterus: Secondary | ICD-10-CM | POA: Diagnosis not present

## 2018-04-30 DIAGNOSIS — N72 Inflammatory disease of cervix uteri: Secondary | ICD-10-CM | POA: Diagnosis not present

## 2018-04-30 LAB — POCT URINE PREGNANCY: Preg Test, Ur: NEGATIVE

## 2018-04-30 NOTE — Progress Notes (Signed)
Patient ID: Amanda Higgins, female   DOB: 06/20/1960, 58 y.o.   MRN: [id number]  Patient has been having postmenopausal spotting while on hormone therapy, with endometrial thickness of 5.1 mm on ultrasound.  ENDOMETRIAL BIOPSY  Patient given informed consent, signed copy in the chart, time out was performed. Appropriate time out taken. . The patient was placed in the lithotomy position and the cervix brought into view with sterile speculum.  Portio of cervix cleansed x 2 with betadine swabs.  A tenaculum was placed in the anterior lip of the cervix.  The uterus was sounded for depth of 8cm. A pipelle was introduced to into the uterus, suction created,  and an endometrial sample was obtained. The thick mucus of the endocervix appeared to have a small endocervical polyp,  Sent as endocervical sample. All equipment was removed and accounted for.  The patient tolerated the procedure well.  Patient given post procedure instructions. The patient will be called with results.   By signing my name below, I, Samul Dada, attest that this documentation has been prepared under the direction and in the presence of Jonnie Kind, MD. Electronically Signed: [place]. 04/30/18. 9:38 AM.  I personally performed the services described in this documentation, which was SCRIBED in my presence. The recorded information has been reviewed and considered accurate. It has been edited as necessary during review. Jonnie Kind, MD

## 2018-04-30 NOTE — Addendum Note (Signed)
Addended by: Diona Fanti A on: 04/30/2018 11:50 AM   Modules accepted: Orders

## 2018-05-01 ENCOUNTER — Telehealth: Payer: Self-pay | Admitting: *Deleted

## 2018-05-01 NOTE — Telephone Encounter (Signed)
Missy called from [place] site regarding patient. Please call Missy at [phone]

## 2018-05-04 ENCOUNTER — Telehealth: Payer: Self-pay | Admitting: Obstetrics and Gynecology

## 2018-05-04 NOTE — Telephone Encounter (Signed)
Pt informed of benign results

## 2018-05-09 MED FILL — CLIMARA PRO PATCH: 0.045-0.015 | 28 days supply | Qty: 4 | Fill #0

## 2018-05-20 IMAGING — NM NM MYOCAR MULTI W/SPECT W/WALL MOTION & EF
2 series · 12 of 12 positions shown · non-contrast
Comparison: none

[Series 1: rest · 6.51mm/px · 6 of 64 frames shown]
[frame 6/64]
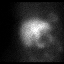
[frame 16/64]
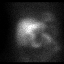
[frame 27/64]
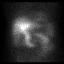
[frame 38/64]
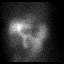
[frame 48/64]
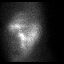
[frame 59/64]
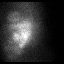

[Series 2: stress gated · 6.51mm/px · 6 of 64 frames shown]
[frame 6/64]
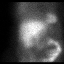
[frame 16/64]
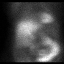
[frame 27/64]
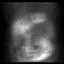
[frame 38/64]
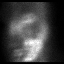
[frame 48/64]
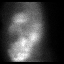
[frame 59/64]
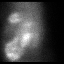

[12 of 12 positions shown; findings below may reference images not displayed]

Canned report from images found in remote index.

Refer to host system for actual result text.

## 2018-05-30 ENCOUNTER — Telehealth: Payer: Self-pay | Admitting: Adult Health

## 2018-05-30 MED ORDER — PROGESTERONE MICRONIZED 200 MG PO CAPS: 200.0000 mg | ORAL_CAPSULE | Freq: Every day | ORAL | 6 refills | Status: DC

## 2018-05-30 NOTE — Telephone Encounter (Signed)
Having bleeding again, will add Prometrium, continue the patch, did discuss with Dr Elonda Husky

## 2018-05-31 MED FILL — CLIMARA PRO PATCH: 0.045-0.015 | 28 days supply | Qty: 4 | Fill #1

## 2018-06-24 MED FILL — CLIMARA PRO PATCH: 0.045-0.015 | 28 days supply | Qty: 4 | Fill #2

## 2018-07-24 MED FILL — CLIMARA PRO PATCH: 0.045-0.015 | 28 days supply | Qty: 4 | Fill #3

## 2018-07-25 ENCOUNTER — Other Ambulatory Visit (HOSPITAL_COMMUNITY): Payer: Self-pay | Admitting: Adult Health

## 2018-07-25 DIAGNOSIS — Z1231 Encounter for screening mammogram for malignant neoplasm of breast: Secondary | ICD-10-CM

## 2018-07-27 DIAGNOSIS — R42 Dizziness and giddiness: Secondary | ICD-10-CM | POA: Insufficient documentation

## 2018-07-29 ENCOUNTER — Inpatient Hospital Stay (HOSPITAL_COMMUNITY): Admission: RE | Admit: 2018-07-29 | Payer: 59 | Source: Ambulatory Visit

## 2018-07-29 ENCOUNTER — Ambulatory Visit (HOSPITAL_COMMUNITY): Payer: 59

## 2018-08-03 DIAGNOSIS — H5203 Hypermetropia, bilateral: Secondary | ICD-10-CM | POA: Diagnosis not present

## 2018-08-22 ENCOUNTER — Telehealth: Payer: Self-pay | Admitting: Adult Health

## 2018-08-22 MED ORDER — PROGESTERONE MICRONIZED 200 MG PO CAPS: 200.0000 mg | ORAL_CAPSULE | Freq: Every day | ORAL | 2 refills | Status: DC

## 2018-08-22 MED FILL — CLIMARA PRO PATCH: 0.045-0.015 | 28 days supply | Qty: 4 | Fill #4

## 2018-08-22 MED FILL — PROGESTERONE MICRONIZED 200: 200 | 90 days supply | Qty: 90 | Fill #0

## 2018-08-22 NOTE — Telephone Encounter (Signed)
Refilled Prometrium to [facility] pharmacy

## 2018-08-28 ENCOUNTER — Encounter: Payer: 59 | Admitting: Family Medicine

## 2018-09-19 ENCOUNTER — Other Ambulatory Visit (HOSPITAL_COMMUNITY): Payer: Self-pay | Admitting: Adult Health

## 2018-09-19 DIAGNOSIS — Z1231 Encounter for screening mammogram for malignant neoplasm of breast: Secondary | ICD-10-CM

## 2018-09-19 MED FILL — CLIMARA PRO PATCH: 0.045-0.015 | 28 days supply | Qty: 4 | Fill #5

## 2018-10-02 ENCOUNTER — Other Ambulatory Visit: Payer: Self-pay

## 2018-10-02 ENCOUNTER — Ambulatory Visit (HOSPITAL_COMMUNITY)
Admission: RE | Admit: 2018-10-02 | Discharge: 2018-10-02 | Disposition: A | Discharge status: Home / Self Care | Payer: 59 | Source: Ambulatory Visit | Attending: Adult Health | Admitting: Adult Health

## 2018-10-02 DIAGNOSIS — Z1231 Encounter for screening mammogram for malignant neoplasm of breast: Secondary | ICD-10-CM | POA: Insufficient documentation

## 2018-10-17 MED FILL — CLIMARA PRO PATCH: 0.045-0.015 | 28 days supply | Qty: 4 | Fill #6

## 2018-11-18 MED FILL — CLIMARA PRO PATCH: 0.045-0.015 | 28 days supply | Qty: 4 | Fill #7

## 2018-12-11 MED FILL — CLIMARA PRO PATCH: 0.045-0.015 | 28 days supply | Qty: 4 | Fill #8

## 2018-12-13 MED FILL — PROGESTERONE MICRONIZED 200: 200 | 90 days supply | Qty: 90 | Fill #1

## 2019-01-01 ENCOUNTER — Other Ambulatory Visit: Payer: Self-pay

## 2019-01-01 DIAGNOSIS — Z20822 Contact with and (suspected) exposure to covid-19: Secondary | ICD-10-CM

## 2019-01-01 NOTE — Addendum Note (Signed)
Addended by: Marvene Staff on: 01/01/2019 01:55 PM   Modules accepted: Orders

## 2019-01-13 MED FILL — CLIMARA PRO PATCH: 0.045-0.015 | 28 days supply | Qty: 4 | Fill #9

## 2019-01-21 ENCOUNTER — Other Ambulatory Visit: Payer: Self-pay | Admitting: Adult Health

## 2019-02-10 ENCOUNTER — Other Ambulatory Visit: Payer: Self-pay | Admitting: Adult Health

## 2019-02-10 MED FILL — CLIMARA PRO PATCH: 0.045-0.015 | 28 days supply | Qty: 4 | Fill #0

## 2019-03-05 MED FILL — CLIMARA PRO PATCH: 0.045-0.015 | 28 days supply | Qty: 4 | Fill #1

## 2019-03-18 MED FILL — PROGESTERONE MICRONIZED 200: 200 | 90 days supply | Qty: 90 | Fill #2

## 2019-04-02 MED FILL — CLIMARA PRO PATCH: 0.045-0.015 | 28 days supply | Qty: 4 | Fill #2

## 2019-05-05 MED FILL — CLIMARA PRO PATCH: 0.045-0.015 | 28 days supply | Qty: 4 | Fill #3

## 2019-05-28 ENCOUNTER — Other Ambulatory Visit: Payer: Self-pay

## 2019-05-28 ENCOUNTER — Encounter: Payer: Self-pay | Admitting: Adult Health

## 2019-05-28 ENCOUNTER — Ambulatory Visit (INDEPENDENT_AMBULATORY_CARE_PROVIDER_SITE_OTHER): Payer: 59 | Admitting: Adult Health

## 2019-05-28 VITALS — BP 118/79 | HR 78 | Ht 65.0 in | Wt 221.0 lb

## 2019-05-28 DIAGNOSIS — Z79890 Hormone replacement therapy (postmenopausal): Secondary | ICD-10-CM | POA: Diagnosis not present

## 2019-05-28 DIAGNOSIS — Z01419 Encounter for gynecological examination (general) (routine) without abnormal findings: Secondary | ICD-10-CM | POA: Diagnosis not present

## 2019-05-28 DIAGNOSIS — N950 Postmenopausal bleeding: Secondary | ICD-10-CM | POA: Diagnosis not present

## 2019-05-28 DIAGNOSIS — Z1211 Encounter for screening for malignant neoplasm of colon: Secondary | ICD-10-CM | POA: Diagnosis not present

## 2019-05-28 DIAGNOSIS — Z1212 Encounter for screening for malignant neoplasm of rectum: Secondary | ICD-10-CM | POA: Diagnosis not present

## 2019-05-28 LAB — HEMOCCULT GUIAC POC 1CARD (OFFICE): Fecal Occult Blood, POC: NEGATIVE

## 2019-05-28 NOTE — Progress Notes (Addendum)
Patient ID: Amanda Higgins, female   DOB: 04/17/1960, 59 y.o.   MRN: [id number] History of Present Illness: Amanda Higgins is a 59 year old white female,married, PM on HRT in for well woman gyn exam.She had normal pap 04/17/2018, with negative HPV. She had some vaginal bleeding with clots in January for a few days and breast tenderness. She had some PMB last year with negative endometrial biopsy. PCP is Cherly Beach NP    Current Medications, Allergies, Past Medical History, Past Surgical History, Family History and Social History were reviewed in [employer] record.     Review of Systems: Patient denies any headaches, hearing loss, fatigue, blurred vision, shortness of breath, chest pain, abdominal pain, problems with bowel movements, urination, or intercourse. No joint pain or mood swings. See HPI for positives.    Physical Exam:BP 118/79 (BP Location: Left Arm, Patient Position: Sitting, Cuff Size: Large)   Pulse 78   Ht 5\' 5"  (1.651 m)   Wt 221 lb (100.2 kg)   BMI 36.78 kg/m  General:  Well developed, well nourished, no acute distress Skin:  Warm and dry Neck:  Midline trachea, normal thyroid, good ROM, no lymphadenopathy Lungs; Clear to auscultation bilaterally Breast:  No dominant palpable mass, retraction, or nipple discharge Cardiovascular: Regular rate and rhythm Abdomen:  Soft, non tender, no hepatosplenomegaly Pelvic:  External genitalia is normal in appearance, no lesions.  The vagina is normal in appearance. Urethra has no lesions or masses. The cervix is smooth and  bulbous.  Uterus is felt to be normal size, shape, and contour.  No adnexal masses or tenderness noted.Bladder is non tender, no masses felt. Rectal: Good sphincter tone, no polyps, or hemorrhoids felt.  Hemoccult negative. Extremities/musculoskeletal:  No swelling,+ varicosities noted esp ankle area bilaterally, no clubbing or cyanosis Psych:  No mood changes, alert and cooperative,seems  happy AA is 1 Fall risk is low PHQ 9 score is 0. Examination chaperoned by Levy Pupa LPN  Impression and Plan:  1. Encounter for well woman exam with routine gynecological exam Physical in 1 year Pap in 2023 Mammogram yearly Labs with PCP   2. Encounter for colorectal cancer screening Colonoscopy per GI  3. Postmenopausal bleeding Return in 2 weeks for GYN [place] to assess uterine lining   4. Hormone replacement therapy (HRT) Continue climara pro and Prometrium  Has refills

## 2019-06-02 MED FILL — CLIMARA PRO PATCH: 0.045-0.015 | 28 days supply | Qty: 4 | Fill #4

## 2019-06-09 ENCOUNTER — Other Ambulatory Visit: Payer: Self-pay | Admitting: Adult Health

## 2019-06-09 MED FILL — PROGESTERONE MICRONIZED 200: 200 | 90 days supply | Qty: 90 | Fill #0

## 2019-06-12 ENCOUNTER — Ambulatory Visit (INDEPENDENT_AMBULATORY_CARE_PROVIDER_SITE_OTHER): Payer: 59

## 2019-06-12 ENCOUNTER — Other Ambulatory Visit: Payer: Self-pay

## 2019-06-12 DIAGNOSIS — N950 Postmenopausal bleeding: Secondary | ICD-10-CM

## 2019-06-12 NOTE — Progress Notes (Signed)
PELVIC US TA/TV: homogeneous anteverted uterus,wnl,mult small nabothian cysts,EEC 3.2 mm,normal ovaries (limited view),ovaries appear mobile,no free fluid,no pain during ultrasound  Chaperone Peggy

## 2019-07-01 MED FILL — CLIMARA PRO PATCH: 0.045-0.015 | 28 days supply | Qty: 4 | Fill #5

## 2019-07-10 ENCOUNTER — Encounter: Payer: 59 | Admitting: Family Medicine

## 2019-07-23 MED FILL — CLIMARA PRO PATCH: 0.045-0.015 | 28 days supply | Qty: 4 | Fill #6

## 2019-08-25 MED FILL — CLIMARA PRO PATCH: 0.045-0.015 | 28 days supply | Qty: 4 | Fill #7

## 2019-08-27 ENCOUNTER — Other Ambulatory Visit (HOSPITAL_COMMUNITY)
Admission: RE | Admit: 2019-08-27 | Discharge: 2019-08-27 | Disposition: A | Discharge status: Home / Self Care | Payer: 59 | Source: Ambulatory Visit | Attending: Family Medicine | Admitting: Family Medicine

## 2019-08-27 ENCOUNTER — Ambulatory Visit (INDEPENDENT_AMBULATORY_CARE_PROVIDER_SITE_OTHER): Payer: 59 | Admitting: Family Medicine

## 2019-08-27 ENCOUNTER — Other Ambulatory Visit: Payer: Self-pay

## 2019-08-27 ENCOUNTER — Encounter: Payer: Self-pay | Admitting: Family Medicine

## 2019-08-27 VITALS — BP 122/77 | HR 76 | Temp 97.3°F | Resp 18 | Ht 65.5 in | Wt 220.0 lb

## 2019-08-27 DIAGNOSIS — E559 Vitamin D deficiency, unspecified: Secondary | ICD-10-CM

## 2019-08-27 DIAGNOSIS — Z0001 Encounter for general adult medical examination with abnormal findings: Secondary | ICD-10-CM | POA: Diagnosis not present

## 2019-08-27 DIAGNOSIS — E6609 Other obesity due to excess calories: Secondary | ICD-10-CM

## 2019-08-27 DIAGNOSIS — Z6836 Body mass index (BMI) 36.0-36.9, adult: Secondary | ICD-10-CM | POA: Diagnosis not present

## 2019-08-27 DIAGNOSIS — Z131 Encounter for screening for diabetes mellitus: Secondary | ICD-10-CM

## 2019-08-27 DIAGNOSIS — E782 Mixed hyperlipidemia: Secondary | ICD-10-CM | POA: Insufficient documentation

## 2019-08-27 LAB — POCT GLYCOSYLATED HEMOGLOBIN (HGB A1C)
HbA1c POC (<> result, manual entry): 5.4 % (ref 4.0–5.6)
HbA1c, POC (controlled diabetic range): 5.4 % (ref 0.0–7.0)
HbA1c, POC (prediabetic range): 5.4 % — AB (ref 5.7–6.4)
Hemoglobin A1C: 5.4 % (ref 4.0–5.6)

## 2019-08-27 LAB — COMPREHENSIVE METABOLIC PANEL
ALT: 22 U/L (ref 0–44)
AST: 19 U/L (ref 15–41)
Albumin: 3.9 g/dL (ref 3.5–5.0)
Alkaline Phosphatase: 65 U/L (ref 38–126)
Anion gap: 10 (ref 5–15)
BUN: 12 mg/dL (ref 6–20)
CO2: 23 mmol/L (ref 22–32)
Calcium: 9 mg/dL (ref 8.9–10.3)
Chloride: 102 mmol/L (ref 98–111)
Creatinine, Ser: 0.75 mg/dL (ref 0.44–1.00)
GFR calc Af Amer: >60 mL/min (ref >60)
GFR calc non Af Amer: >60 mL/min (ref >60)
Glucose, Bld: 108 mg/dL — ABNORMAL HIGH (ref 70–99)
Potassium: 4.6 mmol/L (ref 3.5–5.1)
Sodium: 135 mmol/L (ref 135–145)
Total Bilirubin: 1.1 mg/dL (ref 0.3–1.2)
Total Protein: 7.4 g/dL (ref 6.5–8.1)

## 2019-08-27 LAB — CBC
HCT: 44.2 % (ref 36.0–46.0)
Hemoglobin: 14.5 g/dL (ref 12.0–15.0)
MCH: 31 pg (ref 26.0–34.0)
MCHC: 32.8 g/dL (ref 30.0–36.0)
MCV: 94.4 fL (ref 80.0–100.0)
Platelets: 245 10*3/uL (ref 150–400)
RBC: 4.68 MIL/uL (ref 3.87–5.11)
RDW: 12.7 % (ref 11.5–15.5)
WBC: 8.2 10*3/uL (ref 4.0–10.5)
nRBC: 0 % (ref 0.0–0.2)

## 2019-08-27 LAB — LIPID PANEL
Cholesterol: 197 mg/dL (ref 0–200)
HDL: 40 mg/dL — ABNORMAL LOW (ref >40)
LDL Cholesterol: 138 mg/dL — ABNORMAL HIGH (ref 0–99)
Total CHOL/HDL Ratio: 4.9 RATIO
Triglycerides: 96 mg/dL (ref <150)
VLDL: 19 mg/dL (ref 0–40)

## 2019-08-27 LAB — VITAMIN D 25 HYDROXY (VIT D DEFICIENCY, FRACTURES): Vit D, 25-Hydroxy: 50.57 ng/mL (ref 30–100)

## 2019-08-27 LAB — TSH: TSH: 1.761 u[IU]/mL (ref 0.350–4.500)

## 2019-08-27 NOTE — Assessment & Plan Note (Signed)
A1c check today 5.4% Encouraged well balanced diet

## 2019-08-27 NOTE — Assessment & Plan Note (Signed)
Updated labs today Heart healthy diet encouraged

## 2019-08-27 NOTE — Progress Notes (Signed)
Health Maintenance reviewed -   Immunization History  Administered Date(s) Administered  . Influenza-Unspecified 11/11/2013  . Janssen (J&J) SARS-COV-2 Vaccination 04/20/2019   Last Pap smear: 05/2019 Last mammogram: 09/2018 Last colonoscopy: Due 2023 Last DEXA: Due at 74  Dentist: twice yearly- bridge  Ophtho: once a year-glasses  Exercise: walking as knee tolerates  Smoker: no  Alcohol Use: no   Other doctors caring for patient include:  Patient Care Team: Perlie Mayo, NP as PCP - General (Family Medicine)  End of Life Discussion:  Patient does not have a living will and medical power of attorney  Subjective:   HPI  Amanda Higgins is a 59 y.o. female who presents for annual wellness visit and follow-up on chronic medical conditions.  She has the following concerns: none.  She denies having any sleep difficulties.  She denies having any changes with her teeth or trouble swallowing.  Appetite is good.  Does have a bridge as well.  See the dentist twice yearly.  Denies having any changes in bowel or bladder habits.  Denies having any blood in urine or stool.  Denies having any new memory issues. She does report that she has had somewhat of her brain fog since having Covid in November.  But she reports that it is getting better.  She denies having any falls or injuries.  She denies have any skin issues or concerns.  Denies having any vision changes.  She denies having any chest pain, shortness of breath, cough, headaches, leg swelling.  Does have some experiencing of dizziness versus vertigo-like symptoms.  Usually this presents when she is reaching out high or reaching for something down low.  Or turning of her head.  She reports that when she stays still it stabilizes.  It is not changed in some time.  And she is also not having any debilitating out of work inability to function symptoms related to it. She does report some right knee pain.  But would like to refrain from  that this time.  Review Of Systems  Review of Systems  Constitutional: Negative.   HENT: Negative.   Eyes: Negative.   Respiratory: Negative.   Cardiovascular: Negative.   Gastrointestinal: Negative.   Endocrine: Negative.   Genitourinary: Negative.   Musculoskeletal: Negative.        Right knee pain-See HPI  Skin: Negative.   Allergic/Immunologic: Negative.   Neurological: Positive for dizziness.       See HPI  Hematological: Negative.   Psychiatric/Behavioral: Negative.   All other systems reviewed and are negative.   Objective:   PHYSICAL EXAM:  BP 122/77 (BP Location: Right Arm, Patient Position: Sitting, Cuff Size: Normal)   Pulse 76   Temp (!) 97.3 F (36.3 C) (Temporal)   Resp 18   Ht 5' 5.5" (1.664 m)   Wt 220 lb (99.8 kg)   SpO2 99%   BMI 36.05 kg/m    Physical Exam Vitals and nursing note reviewed.  Constitutional:      Appearance: Normal appearance. She is well-developed and well-groomed. She is obese.  HENT:     Head: Normocephalic.     Right Ear: Hearing, tympanic membrane, ear canal and external ear normal.     Left Ear: Tympanic membrane, ear canal and external ear normal.     Ears:     Weber exam findings: does not lateralize.    Nose: Nose normal.     Mouth/Throat:     Lips: Pink.  Mouth: Mucous membranes are moist.     Pharynx: Oropharynx is clear. Uvula midline.  Eyes:     General: Lids are normal.     Extraocular Movements: Extraocular movements intact.     Conjunctiva/sclera: Conjunctivae normal.     Pupils: Pupils are equal, round, and reactive to light.  Neck:     Thyroid: No thyroid mass, thyromegaly or thyroid tenderness.     Vascular: No carotid bruit.  Cardiovascular:     Rate and Rhythm: Normal rate and regular rhythm.     Pulses: Normal pulses.          Radial pulses are 2+ on the right side and 2+ on the left side.       Dorsalis pedis pulses are 2+ on the right side and 2+ on the left side.     Heart sounds: Normal  heart sounds.     Comments: Noted varicose veins bilaterally. Pulmonary:     Effort: Pulmonary effort is normal.     Breath sounds: Normal breath sounds and air entry.  Abdominal:     General: Abdomen is flat. Bowel sounds are normal.     Palpations: Abdomen is soft.     Tenderness: There is no abdominal tenderness. There is no right CVA tenderness or left CVA tenderness.     Hernia: No hernia is present.  Musculoskeletal:        General: Normal range of motion.     Cervical back: Normal range of motion and neck supple.     Right lower leg: No edema.     Left lower leg: No edema.     Right foot: Normal range of motion.     Left foot: Normal range of motion.     Comments: MAE, ROM intact   Feet:     Right foot:     Skin integrity: Skin integrity normal.     Left foot:     Skin integrity: Skin integrity normal.  Lymphadenopathy:     Cervical: No cervical adenopathy.  Skin:    General: Skin is warm and dry.     Capillary Refill: Capillary refill takes less than 2 seconds.  Neurological:     General: No focal deficit present.     Mental Status: She is alert and oriented to person, place, and time. Mental status is at baseline.     Cranial Nerves: Cranial nerves are intact.     Sensory: Sensation is intact.     Motor: Motor function is intact.     Coordination: Coordination is intact.     Gait: Gait is intact.     Deep Tendon Reflexes: Reflexes are normal and symmetric.  Psychiatric:        Attention and Perception: Attention and perception normal.        Mood and Affect: Mood and affect normal.        Speech: Speech normal.        Behavior: Behavior normal. Behavior is cooperative.        Thought Content: Thought content normal.        Cognition and Memory: Cognition and memory normal.        Judgment: Judgment normal.     Comments: Pleasant in conversation, good eye contact       Depression Screening  Depression screen [facility] 2/9 08/27/2019 05/28/2019 04/17/2018 03/28/2018  02/02/2017  Decreased Interest 0 0 0 0 0  Down, Depressed, Hopeless 0 0 0 0 0  PHQ - 2  Score 0 0 0 0 0  Altered sleeping - 0 0 - -  Tired, decreased energy - 0 0 - -  Change in appetite - 0 0 - -  Feeling bad or failure about yourself  - 0 0 - -  Trouble concentrating - 0 0 - -  Moving slowly or fidgety/restless - 0 0 - -  Suicidal thoughts - 0 0 - -  PHQ-9 Score - 0 0 - -     Falls  Fall Risk  08/27/2019 05/28/2019 03/28/2018 01/07/2018  Falls in the past year? 0 0 0 0  Number falls in past yr: 0 - 0 -  Injury with Fall? 0 - 0 -  Risk for fall due to : No Fall Risks - - -  Follow up Falls evaluation completed - - -    Assessment & Plan:   1. Annual visit for general adult medical examination with abnormal findings   2. Class 2 obesity due to excess calories without serious comorbidity with body mass index (BMI) of 36.0 to 36.9 in adult   3. Elevated cholesterol with elevated triglycerides   4. Screening for diabetes mellitus (DM)   5. Vitamin D deficiency     Tests ordered Orders Placed This Encounter  Procedures  . CBC  . Lipid panel  . TSH  . VITAMIN D 25 Hydroxy (Vit-D Deficiency, Fractures)  . Comprehensive Metabolic Panel (CMET)  . POCT glycosylated hemoglobin (Hb A1C)     Plan: Please see assessment and plan per problem list above.   No orders of the defined types were placed in this encounter.   I have personally reviewed: The patient's medical and social history Their use of alcohol, tobacco or illicit drugs Their current medications and supplements The patient's functional ability including ADLs,fall risks, home safety risks, cognitive, and hearing and visual impairment Diet and physical activities Evidence for depression or mood disorders  The patient's weight, height, BMI, and visual acuity have been recorded in the chart.  I have made referrals, counseling, and provided education to the patient based on review of the above and I have provided the  patient with a written personalized care plan for preventive services.    Note: This dictation was prepared with Dragon dictation along with smaller phrase technology. Similar sounding words can be transcribed inadequately or may not be corrected upon review. Any transcriptional errors that result from this process are unintentional.      Perlie Mayo, NP   08/27/2019

## 2019-08-27 NOTE — Assessment & Plan Note (Signed)
Stable  Amanda Higgins is educated about the importance of exercise daily to help with weight management.A minumum of 30 minutes daily is recommended. Additionally, importance of healthy food choices with portion control discussed.   Wt Readings from Last 3 Encounters:  08/27/19 220 lb (99.8 kg)  05/28/19 221 lb (100.2 kg)  04/30/18 212 lb 9.6 oz (96.4 kg)

## 2019-08-27 NOTE — Assessment & Plan Note (Signed)
Updated labs ordered today

## 2019-08-27 NOTE — Patient Instructions (Signed)
I appreciate the opportunity to provide you with care for your health and wellness. Today we discussed: overall health   Follow up: 6 months   Fasting labs at [facility] No referrals today  Have a great trip! Stay safe :)  Please continue to practice social distancing to keep you, your family, and our community safe.  If you must go out, please wear a mask and practice good handwashing.  It was a pleasure to see you and I look forward to continuing to work together on your health and well-being. Please do not hesitate to call the office if you need care or have questions about your care.  Have a wonderful day and week. With Gratitude, Cherly Beach, DNP, AGNP-BC  HEALTH MAINTENANCE RECOMMENDATIONS:  It is recommended that you get at least 30 minutes of aerobic exercise at least 5 days/week (for weight loss, you may need as much as 60-90 minutes). This can be any activity that gets your heart rate up. This can be divided in 10-15 minute intervals if needed, but try and build up your endurance at least once a week.  Weight bearing exercise is also recommended twice weekly.  Eat a healthy diet with lots of vegetables, fruits and fiber.  "Colorful" foods have a lot of vitamins (ie green vegetables, tomatoes, red peppers, etc).  Limit sweet tea, regular sodas and alcoholic beverages, all of which has a lot of calories and sugar.  Up to 1 alcoholic drink daily may be beneficial for women (unless trying to lose weight, watch sugars).  Drink a lot of water.  Calcium recommendations are 1200-1500 mg daily (1500 mg for postmenopausal women or women without ovaries), and vitamin D 1000 IU daily.  This should be obtained from diet and/or supplements (vitamins), and calcium should not be taken all at once, but in divided doses.  Monthly self breast exams and yearly mammograms for women over the age of 58 is recommended.  Sunscreen of at least SPF 30 should be used on all sun-exposed parts of the skin  when outside between the hours of 10 am and 4 pm (not just when at beach or pool, but even with exercise, golf, tennis, and yard work!)  Use a sunscreen that says "broad spectrum" so it covers both UVA and UVB rays, and make sure to reapply every 1-2 hours.  Remember to change the batteries in your smoke detectors when changing your clock times in the spring and fall.  Use your seat belt every time you are in a car, and please drive safely and not be distracted with cell phones and texting while driving.

## 2019-08-27 NOTE — Assessment & Plan Note (Signed)
Discussed monthly self breast exams and yearly mammograms; at least 30 minutes of aerobic activity at least 5 days/week and weight-bearing exercise 2x/week; proper sunscreen use reviewed; healthy diet, including goals of calcium and vitamin D intake and alcohol recommendations (less than or equal to 1 drink/day) reviewed; regular seatbelt use; changing batteries in smoke detectors.  Immunization recommendations discussed.  Colonoscopy recommendations reviewed.  

## 2019-09-15 MED FILL — PROGESTERONE MICRONIZED 200: 200 | 90 days supply | Qty: 90 | Fill #1

## 2019-09-26 DIAGNOSIS — H5203 Hypermetropia, bilateral: Secondary | ICD-10-CM | POA: Diagnosis not present

## 2019-10-05 MED FILL — CLIMARA PRO PATCH: 0.045-0.015 | 28 days supply | Qty: 4 | Fill #8

## 2019-10-31 MED FILL — CLIMARA PRO PATCH: 0.045-0.015 | 28 days supply | Qty: 4 | Fill #9

## 2019-11-25 MED FILL — CLIMARA PRO PATCH: 0.045-0.015 | 28 days supply | Qty: 4 | Fill #10

## 2019-12-03 MED FILL — PROGESTERONE MICRONIZED 200: 200 | 90 days supply | Qty: 90 | Fill #2

## 2019-12-30 MED FILL — CLIMARA PRO PATCH: 0.045-0.015 | 28 days supply | Qty: 4 | Fill #11

## 2020-01-26 MED FILL — CLIMARA PRO PATCH: 0.045-0.015 | 28 days supply | Qty: 4 | Fill #12

## 2020-02-24 ENCOUNTER — Encounter: Payer: Self-pay | Admitting: Family Medicine

## 2020-02-24 ENCOUNTER — Other Ambulatory Visit: Payer: Self-pay

## 2020-02-24 ENCOUNTER — Telehealth (INDEPENDENT_AMBULATORY_CARE_PROVIDER_SITE_OTHER): Payer: 59 | Admitting: Family Medicine

## 2020-02-24 VITALS — Ht 65.0 in | Wt 225.0 lb

## 2020-02-24 DIAGNOSIS — E669 Obesity, unspecified: Secondary | ICD-10-CM | POA: Diagnosis not present

## 2020-02-24 DIAGNOSIS — R059 Cough, unspecified: Secondary | ICD-10-CM | POA: Diagnosis not present

## 2020-02-24 MED ORDER — PROMETHAZINE-DM 6.25-15 MG/5ML PO SYRP: 2.5000 mL | ORAL_SOLUTION | Freq: Two times a day (BID) | ORAL | 0 refills | Status: DC | PRN

## 2020-02-24 MED ORDER — BENZONATATE 100 MG PO CAPS: 100.0000 mg | ORAL_CAPSULE | Freq: Three times a day (TID) | ORAL | 1 refills | Status: DC | PRN

## 2020-02-24 NOTE — Assessment & Plan Note (Signed)
Reports gaining some, over holidays, but is getting back on track with [facility]    Wt Readings from Last 3 Encounters:  02/24/20 225 lb (102.1 kg)  08/27/19 220 lb (99.8 kg)  05/28/19 221 lb (100.2 kg)

## 2020-02-24 NOTE — Progress Notes (Signed)
Virtual Visit via Telephone Note   This visit type was conducted due to national recommendations for restrictions regarding the COVID-19 Pandemic (e.g. social distancing) in an effort to limit this patient's exposure and mitigate transmission in our community.  Due to her co-morbid illnesses, this patient is at least at moderate risk for complications without adequate follow up.  This format is felt to be most appropriate for this patient at this time.  The patient did not have access to video technology/had technical difficulties with video requiring transitioning to audio format only (telephone).  All issues noted in this document were discussed and addressed.  No physical exam could be performed with this format.    Evaluation Performed:  Follow-up visit  Date:  02/24/2020   ID:  Amanda Higgins, DOB 10/27/1960, MRN [id number]  Patient Location: Home Provider Location: Home Office   Participants: Nurse for intake and work up; Patient and Provider for Visit and Wrap up  Method of visit: Telephone Location of Patient: Home Location of Provider: Office Consent was obtain for visit over the telephone. Services rendered by provider: Visit was performed via telephone  I verified that I am speaking with the correct person using two identifiers.  PCP:  Perlie Mayo, NP   Chief Complaint: Cough  History of Present Illness:    Amanda Higgins is a 60 y.o. female with history of stated below.   Cough: Reports that she has had a cough for 2 to 3 days seems to be getting a little bit worse.  Reports that she has tried over-the-counter treatments without success.  Denies having COVID was negative on her test.  Denies having any production with the cough.  Just dry and hacking in nature.  Reports prior to the cough she did feel like she had some sinus drainage but she does not really have that anymore.  She denies have any chest pain, shortness of breath with the cough.  Overall she  reports that she is doing well and has no other issues or concerns.  She does report that she has gained a little bit of weight over the holiday season but otherwise is doing really well and is working to get back on [employer].  She has no other issues or concerns to discuss.  The patient does not have symptoms concerning for COVID-19 infection (fever, chills, cough, or new shortness of breath).   Past Medical, Surgical, Social History, Allergies, and Medications have been Reviewed.  Past Medical History:  Diagnosis Date  . Anxiety    Phreesia 07/08/2019  . Elevated cholesterol with elevated triglycerides 10/01/2015  . Encounter for gynecological examination with Papanicolaou smear of cervix 04/17/2018  . Encounter for well woman exam with routine gynecological exam 02/02/2017  . Hematuria 12/11/2012  . Hormone replacement therapy (HRT) 02/02/2017  . Hx: UTI (urinary tract infection)   . Left shoulder pain    Nerve impingement  . Obesity   . Pityriasis rosea 04/17/2018  . PMB (postmenopausal bleeding) 09/24/2015   Usually at time of changing patches   . Postmenopausal HRT (hormone replacement therapy) 12/17/2012   On combi patch  . Screening for colorectal cancer 04/17/2018  . Urinary frequency 07/11/2013  . Urinary pain 12/11/2012  . UTI (urinary tract infection) 11/29/2016   +Ecoli   Past Surgical History:  Procedure Laterality Date  . CHOLECYSTECTOMY N/A    Phreesia 07/08/2019  . COLONOSCOPY WITH ESOPHAGOGASTRODUODENOSCOPY (EGD) N/A 10/18/2012   Procedure: COLONOSCOPY WITH ESOPHAGOGASTRODUODENOSCOPY (  EGD);  Surgeon: Daneil Dolin, MD;  Location: [facility];  Service: Endoscopy;  Laterality: N/A;  9:45 AM  . LAPAROSCOPIC CHOLECYSTECTOMY       Current Meds  Medication Sig  . CALCIUM PO Take by mouth daily.  . cholecalciferol (VITAMIN D) 1000 units tablet Take 5,000 Units by mouth daily.  Marland Kitchen CLIMARA PRO 0.045-0.015 MG/DAY PLACE 1 PATCH ONTO THE SKIN ONCE A WEEK.  Marland Kitchen  ibuprofen (ADVIL,MOTRIN) 200 MG tablet Take 200 mg by mouth every 6 (six) hours as needed for pain. As needed  . Multiple Vitamins-Minerals (ZINC PO) Take by mouth daily.  . progesterone (PROMETRIUM) 200 MG capsule TAKE 1 CAPSULE (200 MG TOTAL) BY MOUTH DAILY.  . vitamin C (ASCORBIC ACID) 500 MG tablet Take 1,000 mg by mouth daily.      Allergies:   Macrobid [nitrofurantoin monohyd macro]   ROS:   Please see the history of present illness.    All other systems reviewed and are negative.   Labs/Other Tests and Data Reviewed:    Recent Labs: 08/27/2019: ALT 22; BUN 12; Creatinine, Ser 0.75; Hemoglobin 14.5; Platelets 245; Potassium 4.6; Sodium 135; TSH 1.761   Recent Lipid Panel Lab Results  Component Value Date/Time   CHOL 197 08/27/2019 11:55 AM   CHOL 238 (H) 09/05/2016 08:57 AM   TRIG 96 08/27/2019 11:55 AM   HDL 40 (L) 08/27/2019 11:55 AM   HDL 43 09/05/2016 08:57 AM   CHOLHDL 4.9 08/27/2019 11:55 AM   LDLCALC 138 (H) 08/27/2019 11:55 AM   LDLCALC 110 (H) 03/28/2018 09:45 AM    Wt Readings from Last 3 Encounters:  02/24/20 225 lb (102.1 kg)  08/27/19 220 lb (99.8 kg)  05/28/19 221 lb (100.2 kg)     Objective:    Vital Signs:  Ht 5\' 5"  (1.651 m)   Wt 225 lb (102.1 kg)   BMI 37.44 kg/m    VITAL SIGNS:  reviewed GEN:  Alert and oriented RESPIRATORY:  No shortness of breath noted in conversation PSYCH:  Normal affect  ASSESSMENT & PLAN:     1. Cough  - benzonatate (TESSALON) 100 MG capsule; Take 1 capsule (100 mg total) by mouth 3 (three) times daily as needed for cough.  Dispense: 30 capsule; Refill: 1 - promethazine-dextromethorphan (PROMETHAZINE-DM) 6.25-15 MG/5ML syrup; Take 2.5 mLs by mouth 2 (two) times daily as needed for cough.  Dispense: 118 mL; Refill: 0  2. Obesity (BMI 35.0-39.9 without comorbidity)   Time:   Today, I have spent 7  minutes with the patient with telehealth technology discussing the above problems.     Medication  Adjustments/Labs and Tests Ordered: Current medicines are reviewed at length with the patient today.  Concerns regarding medicines are outlined above.   Tests Ordered: No orders of the defined types were placed in this encounter.   Medication Changes: No orders of the defined types were placed in this encounter.    Disposition:  Follow up Late July for CPE- fasting labs  Signed, Perlie Mayo, NP  02/24/2020 9:50 AM     [facility]

## 2020-02-24 NOTE — Patient Instructions (Addendum)
  I appreciate the opportunity to provide you with care for your health and wellness.  Follow up: July- CPE -fasting labs same day (last CPE was 7/21) No labs or referrals today  Congratulations on the little bundle of joy coming in a few weeks!  I hope your cough gets better, please just call if not.  Stay safe and warm!  Please continue to practice social distancing to keep you, your family, and our community safe.  If you must go out, please wear a mask and practice good handwashing.  It was a pleasure to see you and I look forward to continuing to work together on your health and well-being. Please do not hesitate to call the office if you need care or have questions about your care.  Have a wonderful day. With Gratitude, Cherly Beach, DNP, AGNP-BC

## 2020-02-24 NOTE — Assessment & Plan Note (Signed)
COVID negative  On going cough not controlled with OTC treatments Will do prescription Promethazine DM and Perles  Do not think antibiotic or prednisone is need, but will consider if worsen over the week.   Reviewed side effects, risks and benefits of medication.   Patient acknowledged agreement and understanding of the plan.

## 2020-02-27 ENCOUNTER — Ambulatory Visit: Payer: 59 | Admitting: Family Medicine

## 2020-03-04 ENCOUNTER — Other Ambulatory Visit: Payer: Self-pay | Admitting: Adult Health

## 2020-03-05 ENCOUNTER — Other Ambulatory Visit: Payer: Self-pay | Admitting: Adult Health

## 2020-03-05 MED FILL — CLIMARA PRO PATCH: 0.045-0.015 | 28 days supply | Qty: 4 | Fill #0

## 2020-03-12 ENCOUNTER — Other Ambulatory Visit: Payer: Self-pay | Admitting: Adult Health

## 2020-03-23 MED FILL — PROGESTERONE MICRONIZED 200: 200 | 90 days supply | Qty: 90 | Fill #0

## 2020-03-27 MED FILL — CLIMARA PRO PATCH: 0.045-0.015 | 28 days supply | Qty: 4 | Fill #1

## 2020-04-26 MED FILL — CLIMARA PRO PATCH: 0.045-0.015 | 28 days supply | Qty: 4 | Fill #2

## 2020-04-27 ENCOUNTER — Other Ambulatory Visit (HOSPITAL_BASED_OUTPATIENT_CLINIC_OR_DEPARTMENT_OTHER): Payer: Self-pay

## 2020-05-04 ENCOUNTER — Other Ambulatory Visit (HOSPITAL_COMMUNITY): Payer: Self-pay | Admitting: Adult Health

## 2020-05-04 DIAGNOSIS — Z1231 Encounter for screening mammogram for malignant neoplasm of breast: Secondary | ICD-10-CM

## 2020-05-06 ENCOUNTER — Ambulatory Visit (HOSPITAL_COMMUNITY)
Admission: RE | Admit: 2020-05-06 | Discharge: 2020-05-06 | Disposition: A | Discharge status: Home / Self Care | Payer: 59 | Source: Ambulatory Visit | Attending: Adult Health | Admitting: Adult Health

## 2020-05-06 DIAGNOSIS — Z1231 Encounter for screening mammogram for malignant neoplasm of breast: Secondary | ICD-10-CM | POA: Diagnosis not present

## 2020-05-17 MED FILL — Estradiol-Levonorgestrel TD Patch Weekly 0.045-0.015 MG/DAY: TRANSDERMAL | 28 days supply | Qty: 4 | Fill #0 | Status: CN

## 2020-05-18 ENCOUNTER — Other Ambulatory Visit (HOSPITAL_COMMUNITY): Payer: Self-pay

## 2020-05-19 ENCOUNTER — Other Ambulatory Visit (HOSPITAL_COMMUNITY): Payer: Self-pay

## 2020-05-26 ENCOUNTER — Other Ambulatory Visit (HOSPITAL_COMMUNITY): Payer: Self-pay

## 2020-05-28 ENCOUNTER — Other Ambulatory Visit (HOSPITAL_COMMUNITY): Payer: Self-pay

## 2020-05-28 MED FILL — Progesterone Cap 200 MG: ORAL | 90 days supply | Qty: 90 | Fill #0 | Status: CN

## 2020-05-28 MED FILL — Estradiol-Levonorgestrel TD Patch Weekly 0.045-0.015 MG/DAY: TRANSDERMAL | 28 days supply | Qty: 4 | Fill #0 | Status: AC

## 2020-06-08 ENCOUNTER — Other Ambulatory Visit (HOSPITAL_COMMUNITY): Payer: Self-pay

## 2020-06-10 ENCOUNTER — Other Ambulatory Visit (HOSPITAL_COMMUNITY): Payer: Self-pay

## 2020-06-16 MED FILL — Progesterone Cap 200 MG: ORAL | 90 days supply | Qty: 90 | Fill #0 | Status: AC

## 2020-06-17 ENCOUNTER — Other Ambulatory Visit (HOSPITAL_COMMUNITY): Payer: Self-pay

## 2020-06-21 ENCOUNTER — Other Ambulatory Visit (HOSPITAL_COMMUNITY): Payer: Self-pay

## 2020-06-22 ENCOUNTER — Other Ambulatory Visit (HOSPITAL_COMMUNITY): Payer: Self-pay

## 2020-06-26 ENCOUNTER — Other Ambulatory Visit (HOSPITAL_COMMUNITY): Payer: Self-pay

## 2020-06-27 MED FILL — Estradiol-Levonorgestrel TD Patch Weekly 0.045-0.015 MG/DAY: TRANSDERMAL | 28 days supply | Qty: 4 | Fill #1 | Status: AC

## 2020-06-28 ENCOUNTER — Other Ambulatory Visit (HOSPITAL_COMMUNITY): Payer: Self-pay

## 2020-06-28 DIAGNOSIS — M545 Low back pain, unspecified: Secondary | ICD-10-CM | POA: Diagnosis not present

## 2020-06-28 DIAGNOSIS — S83241A Other tear of medial meniscus, current injury, right knee, initial encounter: Secondary | ICD-10-CM | POA: Diagnosis not present

## 2020-06-28 MED ORDER — PREDNISONE 10 MG PO TABS
ORAL_TABLET | ORAL | 0 refills | Status: AC
  Filled 2020-06-28: qty 48, 12d supply, fill #0

## 2020-07-01 ENCOUNTER — Other Ambulatory Visit (HOSPITAL_COMMUNITY): Payer: Self-pay

## 2020-07-13 DIAGNOSIS — S83241D Other tear of medial meniscus, current injury, right knee, subsequent encounter: Secondary | ICD-10-CM | POA: Diagnosis not present

## 2020-07-27 ENCOUNTER — Other Ambulatory Visit (HOSPITAL_COMMUNITY): Payer: Self-pay

## 2020-07-27 MED FILL — Estradiol-Levonorgestrel TD Patch Weekly 0.045-0.015 MG/DAY: TRANSDERMAL | 28 days supply | Qty: 4 | Fill #2 | Status: AC

## 2020-08-22 MED FILL — Estradiol-Levonorgestrel TD Patch Weekly 0.045-0.015 MG/DAY: TRANSDERMAL | 28 days supply | Qty: 4 | Fill #3 | Status: AC

## 2020-08-23 ENCOUNTER — Other Ambulatory Visit (HOSPITAL_COMMUNITY): Payer: Self-pay

## 2020-09-01 ENCOUNTER — Other Ambulatory Visit (HOSPITAL_COMMUNITY): Payer: Self-pay

## 2020-09-01 ENCOUNTER — Other Ambulatory Visit: Payer: Self-pay

## 2020-09-01 ENCOUNTER — Encounter: Payer: 59 | Admitting: Family Medicine

## 2020-09-01 ENCOUNTER — Ambulatory Visit (INDEPENDENT_AMBULATORY_CARE_PROVIDER_SITE_OTHER): Payer: 59 | Admitting: Nurse Practitioner

## 2020-09-01 ENCOUNTER — Encounter: Payer: Self-pay | Admitting: Nurse Practitioner

## 2020-09-01 VITALS — BP 121/84 | HR 86 | Temp 97.1°F | Ht 65.5 in | Wt 241.0 lb

## 2020-09-01 DIAGNOSIS — G8929 Other chronic pain: Secondary | ICD-10-CM

## 2020-09-01 DIAGNOSIS — Z0001 Encounter for general adult medical examination with abnormal findings: Secondary | ICD-10-CM

## 2020-09-01 DIAGNOSIS — K219 Gastro-esophageal reflux disease without esophagitis: Secondary | ICD-10-CM

## 2020-09-01 DIAGNOSIS — M25561 Pain in right knee: Secondary | ICD-10-CM | POA: Diagnosis not present

## 2020-09-01 DIAGNOSIS — E669 Obesity, unspecified: Secondary | ICD-10-CM | POA: Diagnosis not present

## 2020-09-01 DIAGNOSIS — F419 Anxiety disorder, unspecified: Secondary | ICD-10-CM | POA: Diagnosis not present

## 2020-09-01 MED ORDER — HYDROXYZINE PAMOATE 25 MG PO CAPS
25.0000 mg | ORAL_CAPSULE | Freq: Three times a day (TID) | ORAL | 0 refills | Status: DC | PRN
  Filled 2020-09-01: qty 30, 10d supply, fill #0

## 2020-09-01 MED ORDER — OMEPRAZOLE 20 MG PO CPDR
20.0000 mg | DELAYED_RELEASE_CAPSULE | Freq: Every day | ORAL | 3 refills | Status: DC
  Filled 2020-09-01: qty 90, 90d supply, fill #0
  Filled 2021-03-24: qty 90, 90d supply, fill #1
  Filled 2021-06-20: qty 90, 90d supply, fill #2

## 2020-09-01 NOTE — Progress Notes (Signed)
Established Patient Office Visit  Subjective:  Patient ID: Amanda Higgins, female    DOB: Aug 03, 1960  Age: 60 y.o. MRN: [id number]  CC:  Chief Complaint  Patient presents with   Annual Exam    CPE    HPI Amanda Higgins presents for physical exam.  She states she lost 60 pounds with weight watchers. She states she got COVID and gained about 30 pounds back and stopped doing [employer]. She staets she has had some GERD, but it has responded well to OTC omeprazole.  She had right knee pain and had steroid injections with Dr. Noemi Chapel. Her pain has improved greatly.  She states she has had some anxiety at home and would like to try medication. She does not want a long-term maintenance medication. She has a cousin that is having a hard time coming off of her SNRI, and she is concerned with withdrawal.  Past Medical History:  Diagnosis Date   Anxiety    Phreesia 07/08/2019   Elevated cholesterol with elevated triglycerides 10/01/2015   Encounter for gynecological examination with Papanicolaou smear of cervix 04/17/2018   Encounter for well woman exam with routine gynecological exam 02/02/2017   Hematuria 12/11/2012   Hormone replacement therapy (HRT) 02/02/2017   Hx: UTI (urinary tract infection)    Left shoulder pain    Nerve impingement   Obesity    Pityriasis rosea 04/17/2018   PMB (postmenopausal bleeding) 09/24/2015   Usually at time of changing patches    Postmenopausal HRT (hormone replacement therapy) 12/17/2012   On combi patch   Screening for colorectal cancer 04/17/2018   Urinary frequency 07/11/2013   Urinary pain 12/11/2012   UTI (urinary tract infection) 11/29/2016   +Ecoli    Past Surgical History:  Procedure Laterality Date   CHOLECYSTECTOMY N/A    Phreesia 07/08/2019   COLONOSCOPY WITH ESOPHAGOGASTRODUODENOSCOPY (EGD) N/A 10/18/2012   Procedure: COLONOSCOPY WITH ESOPHAGOGASTRODUODENOSCOPY (EGD);  Surgeon: Daneil Dolin, MD;  Location: [facility];  Service:  Endoscopy;  Laterality: N/A;  9:45 AM   LAPAROSCOPIC CHOLECYSTECTOMY      Family History  Problem Relation Age of Onset   Hyperlipidemia Mother    Hypertension Mother    GER disease Mother    Heart disease Father        atrial fib   Hypertension Father    Arthritis Father        RA   Heart disease Paternal Grandmother        CHF   Hyperlipidemia Maternal Grandmother    Kidney disease Brother        kidney stones   Other Sister        precancerous breast lesion   Kidney disease Brother        kidney stones   Colon cancer Neg Hx     Social History   Socioeconomic History   Marital status: Married    Spouse name: Jeneen Rinks    Number of children: 2   Years of education: 16   Highest education level: Bachelor's degree (e.g., BA, AB, BS)  Occupational History   Not on file  Tobacco Use   Smoking status: Never   Smokeless tobacco: Never  Vaping Use   Vaping Use: Never used  Substance and Sexual Activity   Alcohol use: Yes    Comment: occasional wine   Drug use: No   Sexual activity: Yes    Birth control/protection: Post-menopausal  Other Topics Concern   Not on file  Social History Narrative   Lives with husband, Jeneen Rinks, married 9 years.      Blended family: each had two.   Two daughters   He had a daughter and son      Enjoys traveling: within [place], [place], [place]       Sunscreen and seat belt wearer   Well-balanced diet   Drinks water- does enjoy diet coke      Does take vitamin D and C         Social Determinants of Radio broadcast assistant Strain: Not on file  Food Insecurity: Not on file  Transportation Needs: Not on file  Physical Activity: Not on file  Stress: Not on file  Social Connections: Not on file  Intimate Partner Violence: Not on file    Outpatient Medications Prior to Visit  Medication Sig Dispense Refill   CALCIUM PO Take by mouth daily.     cholecalciferol (VITAMIN D) 1000 units tablet Take 5,000 Units by mouth daily.      estradiol-levonorgestrel (CLIMARAPRO) 0.045-0.015 MG/DAY PLACE 1 PATCH ONTO THE SKIN ONCE A WEEK. 4 patch 12   ibuprofen (ADVIL,MOTRIN) 200 MG tablet Take 200 mg by mouth every 6 (six) hours as needed for pain. As needed     progesterone (PROMETRIUM) 200 MG capsule TAKE 1 CAPSULE (200 MG TOTAL) BY MOUTH DAILY. 90 capsule 2   vitamin C (ASCORBIC ACID) 500 MG tablet Take 1,000 mg by mouth daily.      benzonatate (TESSALON) 100 MG capsule Take 1 capsule (100 mg total) by mouth 3 (three) times daily as needed for cough. 30 capsule 1   Multiple Vitamins-Minerals (ZINC PO) Take by mouth daily.     promethazine-dextromethorphan (PROMETHAZINE-DM) 6.25-15 MG/5ML syrup Take 2.5 mLs by mouth 2 (two) times daily as needed for cough. 118 mL 0   No facility-administered medications prior to visit.    Allergies  Allergen Reactions   Macrobid [Nitrofurantoin Monohyd Macro] Hives, Shortness Of Breath and Itching    ROS Review of Systems  Constitutional: Negative.   HENT: Negative.    Eyes: Negative.   Respiratory: Negative.    Cardiovascular: Negative.   Gastrointestinal: Negative.   Endocrine: Negative.   Genitourinary: Negative.   Musculoskeletal: Negative.   Skin: Negative.   Allergic/Immunologic: Negative.   Neurological: Negative.   Hematological: Negative.   Psychiatric/Behavioral:  Negative for self-injury and suicidal ideas. The patient is nervous/anxious.      Objective:    Physical Exam Constitutional:      Appearance: Normal appearance. She is obese.  HENT:     Head: Normocephalic and atraumatic.     Right Ear: Tympanic membrane, ear canal and external ear normal.     Left Ear: Tympanic membrane, ear canal and external ear normal.     Nose: Nose normal.     Mouth/Throat:     Mouth: Mucous membranes are moist.     Pharynx: Oropharynx is clear.  Eyes:     Extraocular Movements: Extraocular movements intact.     Conjunctiva/sclera: Conjunctivae normal.     Pupils: Pupils  are equal, round, and reactive to light.  Cardiovascular:     Rate and Rhythm: Normal rate and regular rhythm.     Pulses: Normal pulses.     Heart sounds: Normal heart sounds.  Pulmonary:     Effort: Pulmonary effort is normal.     Breath sounds: Normal breath sounds.  Abdominal:     General: Abdomen is flat. Bowel  sounds are normal.     Palpations: Abdomen is soft.  Musculoskeletal:        General: Normal range of motion.     Cervical back: Normal range of motion and neck supple.  Skin:    General: Skin is warm and dry.     Capillary Refill: Capillary refill takes less than 2 seconds.  Neurological:     General: No focal deficit present.     Mental Status: She is alert and oriented to person, place, and time.     Cranial Nerves: No cranial nerve deficit.     Sensory: No sensory deficit.     Motor: No weakness.     Coordination: Coordination normal.     Gait: Gait normal.  Psychiatric:        Mood and Affect: Mood normal.        Behavior: Behavior normal.        Thought Content: Thought content normal.        Judgment: Judgment normal.    BP 121/84 (BP Location: Left Arm, Patient Position: Sitting, Cuff Size: Large)   Pulse 86   Temp (!) 97.1 F (36.2 C) (Temporal)   Ht 5' 5.5" (1.664 m)   Wt 241 lb (109.3 kg)   SpO2 97%   BMI 39.49 kg/m  Wt Readings from Last 3 Encounters:  09/01/20 241 lb (109.3 kg)  02/24/20 225 lb (102.1 kg)  08/27/19 220 lb (99.8 kg)     There are no preventive care reminders to display for this patient.  There are no preventive care reminders to display for this patient.  Lab Results  Component Value Date   TSH 1.761 08/27/2019   Lab Results  Component Value Date   WBC 8.2 08/27/2019   HGB 14.5 08/27/2019   HCT 44.2 08/27/2019   MCV 94.4 08/27/2019   PLT 245 08/27/2019   Lab Results  Component Value Date   NA 135 08/27/2019   K 4.6 08/27/2019   CO2 23 08/27/2019   GLUCOSE 108 (H) 08/27/2019   BUN 12 08/27/2019   CREATININE  0.75 08/27/2019   BILITOT 1.1 08/27/2019   ALKPHOS 65 08/27/2019   AST 19 08/27/2019   ALT 22 08/27/2019   PROT 7.4 08/27/2019   ALBUMIN 3.9 08/27/2019   CALCIUM 9.0 08/27/2019   ANIONGAP 10 08/27/2019   Lab Results  Component Value Date   CHOL 197 08/27/2019   Lab Results  Component Value Date   HDL 40 (L) 08/27/2019   Lab Results  Component Value Date   LDLCALC 138 (H) 08/27/2019   Lab Results  Component Value Date   TRIG 96 08/27/2019   Lab Results  Component Value Date   CHOLHDL 4.9 08/27/2019   Lab Results  Component Value Date   HGBA1C 5.4 08/27/2019   HGBA1C 5.4 08/27/2019   HGBA1C 5.4 (A) 08/27/2019   HGBA1C 5.4 08/27/2019      Assessment & Plan:   Problem List Items Addressed This Visit       Digestive   GERD (gastroesophageal reflux disease)    -worse with weight gain -Rx. Omeprazole; she has been doing well with OTC 20 mg dose       Relevant Medications   omeprazole (PRILOSEC) 20 MG capsule     Other   Obesity (BMI 35.0-39.9 without comorbidity)     Wt Readings from Last 3 Encounters:  09/01/20 241 lb (109.3 kg)  02/24/20 225 lb (102.1 kg)  08/27/19 220 lb (99.8  kg)  -has gained wt since stopping weight watchers -she may resume [employer] to help with dietary modification that is conducive to weight loss      Encounter for general adult medical examination with abnormal findings - Primary    -reports anxiety and GERD today; otherwise physical unremarkable       Relevant Orders   CMP14+EGFR   CBC with Differential/Platelet   Lipid Panel With LDL/HDL Ratio   TSH   Anxiety    -She does not want a long-term maintenance medication. She has a cousin that is having a hard time coming off of her SNRI, and she is concerned with withdrawal. -Rx. Hydroxyzine        Relevant Medications   hydrOXYzine (VISTARIL) 25 MG capsule   Right knee pain    -followed by Dr. Noemi Chapel with [facility] -had joint injection earlier in the year and that helped  greatly; pain free currently        Meds ordered this encounter  Medications   omeprazole (PRILOSEC) 20 MG capsule    Sig: Take 1 capsule (20 mg total) by mouth daily.    Dispense:  90 capsule    Refill:  3   hydrOXYzine (VISTARIL) 25 MG capsule    Sig: Take 1 capsule (25 mg total) by mouth every 8 (eight) hours as needed.    Dispense:  30 capsule    Refill:  0    Follow-up: Return in about 1 year (around 09/01/2021) for Physical Exam (same-day fasting labs).    Noreene Larsson, NP

## 2020-09-01 NOTE — Assessment & Plan Note (Signed)
  Wt Readings from Last 3 Encounters:  09/01/20 241 lb (109.3 kg)  02/24/20 225 lb (102.1 kg)  08/27/19 220 lb (99.8 kg)   -has gained wt since stopping weight watchers -she may resume [employer] to help with dietary modification that is conducive to weight loss

## 2020-09-01 NOTE — Assessment & Plan Note (Signed)
-  worse with weight gain -Rx. Omeprazole; she has been doing well with OTC 20 mg dose

## 2020-09-01 NOTE — Assessment & Plan Note (Signed)
-  She does not want a long-term maintenance medication. She has a cousin that is having a hard time coming off of her SNRI, and she is concerned with withdrawal. -Rx. Hydroxyzine

## 2020-09-01 NOTE — Assessment & Plan Note (Signed)
-  followed by Dr. Noemi Chapel with [facility] -had joint injection earlier in the year and that helped greatly; pain free currently

## 2020-09-01 NOTE — Assessment & Plan Note (Addendum)
-  reports anxiety and GERD today; otherwise physical unremarkable

## 2020-09-01 NOTE — Patient Instructions (Signed)
Please have fasting labs drawn this week. 

## 2020-09-02 DIAGNOSIS — Z0001 Encounter for general adult medical examination with abnormal findings: Secondary | ICD-10-CM | POA: Diagnosis not present

## 2020-09-03 ENCOUNTER — Other Ambulatory Visit: Payer: Self-pay | Admitting: Nurse Practitioner

## 2020-09-03 ENCOUNTER — Encounter: Payer: Self-pay | Admitting: Nurse Practitioner

## 2020-09-03 ENCOUNTER — Other Ambulatory Visit (HOSPITAL_COMMUNITY): Payer: Self-pay

## 2020-09-03 DIAGNOSIS — E7800 Pure hypercholesterolemia, unspecified: Secondary | ICD-10-CM

## 2020-09-03 LAB — CBC WITH DIFFERENTIAL/PLATELET
Basophils Absolute: 0.1 10*3/uL (ref 0.0–0.2)
Basos: 1 %
EOS (ABSOLUTE): 0.2 10*3/uL (ref 0.0–0.4)
Eos: 2 %
Hematocrit: 43.8 % (ref 34.0–46.6)
Hemoglobin: 14.2 g/dL (ref 11.1–15.9)
Immature Grans (Abs): 0 10*3/uL (ref 0.0–0.1)
Immature Granulocytes: 0 %
Lymphocytes Absolute: 2.1 10*3/uL (ref 0.7–3.1)
Lymphs: 24 %
MCH: 30.4 pg (ref 26.6–33.0)
MCHC: 32.4 g/dL (ref 31.5–35.7)
MCV: 94 fL (ref 79–97)
Monocytes Absolute: 0.8 10*3/uL (ref 0.1–0.9)
Monocytes: 9 %
Neutrophils Absolute: 5.7 10*3/uL (ref 1.4–7.0)
Neutrophils: 64 %
Platelets: 238 10*3/uL (ref 150–450)
RBC: 4.67 x10E6/uL (ref 3.77–5.28)
RDW: 13.3 % (ref 11.7–15.4)
WBC: 8.8 10*3/uL (ref 3.4–10.8)

## 2020-09-03 LAB — CMP14+EGFR
ALT: 53 IU/L — ABNORMAL HIGH (ref 0–32)
AST: 29 IU/L (ref 0–40)
Albumin/Globulin Ratio: 1.3 (ref 1.2–2.2)
Albumin: 3.8 g/dL (ref 3.8–4.8)
Alkaline Phosphatase: 87 IU/L (ref 44–121)
BUN/Creatinine Ratio: 20 (ref 12–28)
BUN: 16 mg/dL (ref 8–27)
Bilirubin Total: 0.6 mg/dL (ref 0.0–1.2)
CO2: 25 mmol/L (ref 20–29)
Calcium: 9.4 mg/dL (ref 8.7–10.3)
Chloride: 98 mmol/L (ref 96–106)
Creatinine, Ser: 0.8 mg/dL (ref 0.57–1.00)
Globulin, Total: 3 g/dL (ref 1.5–4.5)
Glucose: 98 mg/dL (ref 65–99)
Potassium: 4.1 mmol/L (ref 3.5–5.2)
Sodium: 138 mmol/L (ref 134–144)
Total Protein: 6.8 g/dL (ref 6.0–8.5)
eGFR: 84 mL/min/{1.73_m2} (ref >59)

## 2020-09-03 LAB — LIPID PANEL WITH LDL/HDL RATIO
Cholesterol, Total: 206 mg/dL — ABNORMAL HIGH (ref 100–199)
HDL: 45 mg/dL (ref >39)
LDL Chol Calc (NIH): 142 mg/dL — ABNORMAL HIGH (ref 0–99)
LDL/HDL Ratio: 3.2 ratio (ref 0.0–3.2)
Triglycerides: 104 mg/dL (ref 0–149)
VLDL Cholesterol Cal: 19 mg/dL (ref 5–40)

## 2020-09-03 LAB — TSH: TSH: 2.62 u[IU]/mL (ref 0.450–4.500)

## 2020-09-03 MED ORDER — ATORVASTATIN CALCIUM 20 MG PO TABS
20.0000 mg | ORAL_TABLET | Freq: Every day | ORAL | 3 refills | Status: DC
  Filled 2020-09-03: qty 90, 90d supply, fill #0

## 2020-09-03 NOTE — Progress Notes (Signed)
LDL, or bad cholesterol, is elevated at 142, and we want this below 100. ALT is elevated at 53. Not high enough to be concerning since you aren't having abdominal pain.Marland Kitchen is likely fatty liver. You can try continuing Weight Watchers without medication, or I can send in a statin for you. Either way, we should recheck labs in 3-4 months (please set her up for an appt.). If you want medication let me know, and I will send in atorvastatin.

## 2020-09-06 ENCOUNTER — Encounter: Payer: Self-pay | Admitting: Nurse Practitioner

## 2020-09-07 ENCOUNTER — Other Ambulatory Visit (HOSPITAL_COMMUNITY): Payer: Self-pay

## 2020-09-07 ENCOUNTER — Other Ambulatory Visit: Payer: Self-pay | Admitting: Nurse Practitioner

## 2020-09-07 DIAGNOSIS — E669 Obesity, unspecified: Secondary | ICD-10-CM

## 2020-09-07 MED ORDER — WEGOVY 0.5 MG/0.5ML ~~LOC~~ SOAJ
0.5000 mg | SUBCUTANEOUS | 0 refills | Status: DC
  Filled 2020-09-07: qty 2, 28d supply, fill #0

## 2020-09-07 MED ORDER — WEGOVY 2.4 MG/0.75ML ~~LOC~~ SOAJ
2.4000 mg | SUBCUTANEOUS | 1 refills | Status: DC
  Filled 2020-09-07: qty 3, 28d supply, fill #0

## 2020-09-07 MED ORDER — WEGOVY 1.7 MG/0.75ML ~~LOC~~ SOAJ
1.7000 mg | SUBCUTANEOUS | 0 refills | Status: DC
  Filled 2020-09-07: qty 3, 28d supply, fill #0

## 2020-09-07 MED ORDER — WEGOVY 1 MG/0.5ML ~~LOC~~ SOAJ
1.0000 mg | SUBCUTANEOUS | 0 refills | Status: DC
  Filled 2020-09-07: qty 2, 28d supply, fill #0

## 2020-09-07 MED ORDER — WEGOVY 0.25 MG/0.5ML ~~LOC~~ SOAJ
0.2500 mg | SUBCUTANEOUS | 0 refills | Status: DC
  Filled 2020-09-07 – 2020-09-16 (×2): qty 2, 28d supply, fill #0

## 2020-09-07 NOTE — Telephone Encounter (Signed)
Pt informed

## 2020-09-07 NOTE — Telephone Encounter (Signed)
Please set her up for med check in 2 months for Wegovy/weight loss.

## 2020-09-07 NOTE — Progress Notes (Signed)
-  Rx. [id number] for weight mgmt

## 2020-09-07 NOTE — Telephone Encounter (Signed)
I will get her in with medical weight management. It is about $1400 a month for the injections, and medical weight management is more likely to get the documentation correct to get the insurance to pay for it. I write the rybelsus and ozempic, which is the same molecule, but you have to be diabetic to get those. The last time I wrote Wegovy, insurance didn't accept it, but they did for [facility], so I defer to them.

## 2020-09-07 NOTE — Telephone Encounter (Signed)
Pt says she has a friend who works with [employer] and the insurance covers it almost completely she just pays $25. She would like for you to send this in to see if her insurance will cover it as well, she has the same insurance. Uses [facility].

## 2020-09-09 ENCOUNTER — Encounter: Payer: Self-pay | Admitting: Nurse Practitioner

## 2020-09-15 ENCOUNTER — Other Ambulatory Visit (HOSPITAL_COMMUNITY): Payer: Self-pay

## 2020-09-16 ENCOUNTER — Telehealth: Payer: Self-pay

## 2020-09-16 ENCOUNTER — Other Ambulatory Visit: Payer: Self-pay | Admitting: Nurse Practitioner

## 2020-09-16 ENCOUNTER — Encounter: Payer: Self-pay | Admitting: Nurse Practitioner

## 2020-09-16 ENCOUNTER — Other Ambulatory Visit (HOSPITAL_COMMUNITY): Payer: Self-pay

## 2020-09-16 DIAGNOSIS — E669 Obesity, unspecified: Secondary | ICD-10-CM

## 2020-09-16 MED ORDER — OZEMPIC (1 MG/DOSE) 4 MG/3ML ~~LOC~~ SOPN
1.0000 mg | PEN_INJECTOR | SUBCUTANEOUS | 0 refills | Status: DC
  Filled 2020-09-16: qty 3, 28d supply, fill #0

## 2020-09-16 MED ORDER — OZEMPIC (0.25 OR 0.5 MG/DOSE) 2 MG/1.5ML ~~LOC~~ SOPN
PEN_INJECTOR | SUBCUTANEOUS | 0 refills | Status: DC
Start: 2020-09-16 — End: 2020-09-17
  Filled 2020-09-16: qty 1.5, 42d supply, fill #0

## 2020-09-16 NOTE — Progress Notes (Signed)
-  Unable to get wegovy; Rx. ozempic

## 2020-09-16 NOTE — Telephone Encounter (Signed)
Pt called saying she has not heard anything on the weight loss medication. I instructed her to call the pharmacy and see if that went through. She is going to call us back.  Also she wanted to know your thoughts on Ozempic if this gets denied. Please advise.

## 2020-09-16 NOTE — Telephone Encounter (Signed)
I sent in Ozempic x3 months.  Please se there up for med check in 3 months.

## 2020-09-16 NOTE — Telephone Encounter (Signed)
Her last A1c was 5.4, so she isn't even in the prediabetic range. Ozempic is indicated for DM, so that isn't likely to get through a prior authorization... even though it is the same molecule.

## 2020-09-16 NOTE — Telephone Encounter (Signed)
Pt says she spoke with the pharmacist and they told her no PA is required for the [place]. She wanted to know if you would prescribe it for weight loss? She is aware it is for diabetes but she says she knows someone who takes this for weight loss. Please advise.

## 2020-09-17 ENCOUNTER — Other Ambulatory Visit: Payer: Self-pay | Admitting: Nurse Practitioner

## 2020-09-17 ENCOUNTER — Other Ambulatory Visit (HOSPITAL_COMMUNITY): Payer: Self-pay

## 2020-09-17 MED ORDER — RYBELSUS 14 MG PO TABS
14.0000 mg | ORAL_TABLET | Freq: Every day | ORAL | 1 refills | Status: DC
  Filled 2020-09-17: qty 30, fill #0

## 2020-09-17 MED ORDER — RYBELSUS 3 MG PO TABS: 3.0000 mg | ORAL_TABLET | Freq: Every day | ORAL | 0 refills | Status: DC

## 2020-09-17 MED ORDER — RYBELSUS 7 MG PO TABS
7.0000 mg | ORAL_TABLET | Freq: Every day | ORAL | 0 refills | Status: DC
  Filled 2020-09-17 – 2020-10-29 (×2): qty 30, 30d supply, fill #0

## 2020-09-17 NOTE — Telephone Encounter (Signed)
Spoke with pt by phone.

## 2020-09-17 NOTE — Telephone Encounter (Signed)
Pt informed. This is in backorder but the pharmacy told her it should be available soon. She will call back to set up follow up appt.

## 2020-09-20 ENCOUNTER — Other Ambulatory Visit (HOSPITAL_COMMUNITY): Payer: Self-pay

## 2020-09-20 MED FILL — Progesterone Cap 200 MG: ORAL | 90 days supply | Qty: 90 | Fill #1 | Status: AC

## 2020-09-20 MED FILL — Estradiol-Levonorgestrel TD Patch Weekly 0.045-0.015 MG/DAY: TRANSDERMAL | 28 days supply | Qty: 4 | Fill #4 | Status: AC

## 2020-09-27 ENCOUNTER — Encounter: Payer: Self-pay | Admitting: Nurse Practitioner

## 2020-09-28 ENCOUNTER — Other Ambulatory Visit: Payer: Self-pay | Admitting: Nurse Practitioner

## 2020-09-28 ENCOUNTER — Other Ambulatory Visit (HOSPITAL_COMMUNITY): Payer: Self-pay

## 2020-09-28 DIAGNOSIS — E669 Obesity, unspecified: Secondary | ICD-10-CM

## 2020-09-28 MED ORDER — RYBELSUS 3 MG PO TABS: 3.0000 mg | ORAL_TABLET | Freq: Every day | ORAL | 0 refills | Status: DC

## 2020-09-28 NOTE — Telephone Encounter (Signed)
I called her and talked to her. Left sample cards at the front desk.

## 2020-10-06 ENCOUNTER — Encounter: Payer: Self-pay | Admitting: Nurse Practitioner

## 2020-10-07 ENCOUNTER — Other Ambulatory Visit: Payer: Self-pay | Admitting: Nurse Practitioner

## 2020-10-07 DIAGNOSIS — E669 Obesity, unspecified: Secondary | ICD-10-CM

## 2020-10-07 MED ORDER — RYBELSUS 3 MG PO TABS: 3.0000 mg | ORAL_TABLET | Freq: Every day | ORAL | 0 refills | Status: AC

## 2020-10-08 ENCOUNTER — Other Ambulatory Visit (HOSPITAL_COMMUNITY): Payer: Self-pay

## 2020-10-08 ENCOUNTER — Other Ambulatory Visit: Payer: Self-pay | Admitting: Nurse Practitioner

## 2020-10-08 DIAGNOSIS — E669 Obesity, unspecified: Secondary | ICD-10-CM

## 2020-10-20 ENCOUNTER — Other Ambulatory Visit (HOSPITAL_COMMUNITY): Payer: Self-pay

## 2020-10-20 MED FILL — Estradiol-Levonorgestrel TD Patch Weekly 0.045-0.015 MG/DAY: TRANSDERMAL | 28 days supply | Qty: 4 | Fill #5 | Status: AC

## 2020-10-28 DIAGNOSIS — S83241A Other tear of medial meniscus, current injury, right knee, initial encounter: Secondary | ICD-10-CM | POA: Diagnosis not present

## 2020-10-29 ENCOUNTER — Other Ambulatory Visit (HOSPITAL_COMMUNITY): Payer: Self-pay

## 2020-11-02 ENCOUNTER — Telehealth: Payer: 59 | Admitting: Nurse Practitioner

## 2020-11-02 DIAGNOSIS — N3000 Acute cystitis without hematuria: Secondary | ICD-10-CM | POA: Diagnosis not present

## 2020-11-02 MED ORDER — SULFAMETHOXAZOLE-TRIMETHOPRIM 800-160 MG PO TABS: 1.0000 | ORAL_TABLET | Freq: Two times a day (BID) | ORAL | 0 refills | Status: AC

## 2020-11-02 NOTE — Progress Notes (Signed)
E-Visit for Urinary Problems ? ?We are sorry that you are not feeling well.  Here is how we plan to help! ? ?Based on what you shared with me it looks like you most likely have a simple urinary tract infection. ? ?A UTI (Urinary Tract Infection) is a bacterial infection of the bladder. ? ?Most cases of urinary tract infections are simple to treat but a key part of your care is to encourage you to drink plenty of fluids and watch your symptoms carefully. ? ?I have prescribed Bactrim DS One tablet twice a day for 5 days.  Your symptoms should gradually improve. Call us if the burning in your urine worsens, you develop worsening fever, back pain or pelvic pain or if your symptoms do not resolve after completing the antibiotic. ? ?Urinary tract infections can be prevented by drinking plenty of water to keep your body hydrated.  Also be sure when you wipe, wipe from front to back and don't hold it in!  If possible, empty your bladder every 4 hours. ? ?HOME CARE ?Drink plenty of fluids ?Compete the full course of the antibiotics even if the symptoms resolve ?Remember, when you need to go?go. Holding in your urine can increase the likelihood of getting a UTI! ?GET HELP RIGHT AWAY IF: ?You cannot urinate ?You get a high fever ?Worsening back pain occurs ?You see blood in your urine ?You feel sick to your stomach or throw up ?You feel like you are going to pass out ? ?MAKE SURE YOU  ?Understand these instructions. ?Will watch your condition. ?Will get help right away if you are not doing well or get worse. ? ? ?Thank you for choosing an e-visit. ? ?Your e-visit answers were reviewed by a board certified advanced clinical practitioner to complete your personal care plan. Depending upon the condition, your plan could have included both over the counter or prescription medications. ? ?Please review your pharmacy choice. Make sure the pharmacy is open so you can pick up prescription now. If there is a problem, you may contact  your provider through MyChart messaging and have the prescription routed to another pharmacy.  Your safety is important to us. If you have drug allergies check your prescription carefully.  ? ?For the next 24 hours you can use MyChart to ask questions about today's visit, request a non-urgent call back, or ask for a work or school excuse. ?You will get an email in the next two days asking about your experience. I hope that your e-visit has been valuable and will speed your recovery.  ? ?I spent approximately 7 minutes reviewing the patient's history, current symptoms and coordinating their plan of care today.   ? ?Meds ordered this encounter  ?Medications  ? sulfamethoxazole-trimethoprim (BACTRIM DS) 800-160 MG tablet  ?  Sig: Take 1 tablet by mouth 2 (two) times daily for 5 days.  ?  Dispense:  10 tablet  ?  Refill:  0  ?  ?

## 2020-11-30 ENCOUNTER — Other Ambulatory Visit (HOSPITAL_BASED_OUTPATIENT_CLINIC_OR_DEPARTMENT_OTHER): Payer: Self-pay

## 2020-12-09 DIAGNOSIS — M25561 Pain in right knee: Secondary | ICD-10-CM | POA: Diagnosis not present

## 2020-12-13 MED FILL — Estradiol-Levonorgestrel TD Patch Weekly 0.045-0.015 MG/DAY: TRANSDERMAL | 28 days supply | Qty: 4 | Fill #6 | Status: AC

## 2020-12-14 ENCOUNTER — Other Ambulatory Visit (HOSPITAL_COMMUNITY): Payer: Self-pay

## 2020-12-16 ENCOUNTER — Telehealth: Payer: Self-pay | Admitting: Adult Health

## 2020-12-16 MED ORDER — FLUCONAZOLE 150 MG PO TABS: ORAL_TABLET | ORAL | 1 refills | Status: DC

## 2020-12-16 NOTE — Telephone Encounter (Signed)
Complains of yeast after antibiotics, will rx diflucan

## 2020-12-20 ENCOUNTER — Ambulatory Visit (INDEPENDENT_AMBULATORY_CARE_PROVIDER_SITE_OTHER): Payer: 59 | Admitting: Internal Medicine

## 2020-12-20 ENCOUNTER — Encounter: Payer: Self-pay | Admitting: Internal Medicine

## 2020-12-20 ENCOUNTER — Other Ambulatory Visit: Payer: Self-pay

## 2020-12-20 ENCOUNTER — Other Ambulatory Visit (HOSPITAL_COMMUNITY): Payer: Self-pay

## 2020-12-20 ENCOUNTER — Ambulatory Visit: Payer: 59 | Admitting: Nurse Practitioner

## 2020-12-20 VITALS — BP 127/90 | HR 75 | Temp 97.8°F | Ht 65.5 in | Wt 232.0 lb

## 2020-12-20 DIAGNOSIS — R7303 Prediabetes: Secondary | ICD-10-CM | POA: Diagnosis not present

## 2020-12-20 DIAGNOSIS — R300 Dysuria: Secondary | ICD-10-CM

## 2020-12-20 DIAGNOSIS — E782 Mixed hyperlipidemia: Secondary | ICD-10-CM

## 2020-12-20 DIAGNOSIS — K219 Gastro-esophageal reflux disease without esophagitis: Secondary | ICD-10-CM

## 2020-12-20 DIAGNOSIS — F419 Anxiety disorder, unspecified: Secondary | ICD-10-CM

## 2020-12-20 DIAGNOSIS — E669 Obesity, unspecified: Secondary | ICD-10-CM | POA: Diagnosis not present

## 2020-12-20 LAB — POCT URINALYSIS DIP (CLINITEK)
Bilirubin, UA: NEGATIVE
Blood, UA: NEGATIVE
Glucose, UA: NEGATIVE mg/dL
Ketones, POC UA: NEGATIVE mg/dL
Leukocytes, UA: NEGATIVE
Nitrite, UA: NEGATIVE
POC PROTEIN,UA: NEGATIVE
Spec Grav, UA: 1.02 (ref 1.010–1.025)
Urobilinogen, UA: 0.2 E.U./dL
pH, UA: 7.5 (ref 5.0–8.0)

## 2020-12-20 MED ORDER — WEGOVY 0.5 MG/0.5ML ~~LOC~~ SOAJ
0.5000 mg | SUBCUTANEOUS | 0 refills | Status: DC
  Filled 2020-12-20 – 2021-02-18 (×4): qty 2, 28d supply, fill #0

## 2020-12-20 NOTE — Patient Instructions (Signed)
Please start taking Wegovy instead of Rybelsus.  Please continue taking other medications as prescribed.

## 2020-12-23 ENCOUNTER — Other Ambulatory Visit (HOSPITAL_COMMUNITY): Payer: Self-pay

## 2020-12-23 ENCOUNTER — Other Ambulatory Visit: Payer: Self-pay | Admitting: Adult Health

## 2020-12-23 ENCOUNTER — Other Ambulatory Visit: Payer: Self-pay | Admitting: Internal Medicine

## 2020-12-23 MED ORDER — PROGESTERONE 200 MG PO CAPS
200.0000 mg | ORAL_CAPSULE | Freq: Every day | ORAL | 2 refills | Status: DC
  Filled 2020-12-23 – 2020-12-28 (×2): qty 90, 90d supply, fill #0
  Filled 2021-03-17: qty 90, 90d supply, fill #1
  Filled 2021-06-20: qty 90, 90d supply, fill #2

## 2020-12-24 ENCOUNTER — Other Ambulatory Visit (HOSPITAL_COMMUNITY): Payer: Self-pay

## 2020-12-24 DIAGNOSIS — R7303 Prediabetes: Secondary | ICD-10-CM | POA: Insufficient documentation

## 2020-12-24 NOTE — Assessment & Plan Note (Signed)
Well controlled with Vistaril as needed

## 2020-12-24 NOTE — Assessment & Plan Note (Signed)
Takes Prilosec

## 2020-12-24 NOTE — Progress Notes (Signed)
Acute Office Visit  Subjective:    Patient ID: Amanda Higgins, female    DOB: 08/29/1960, 60 y.o.   MRN: [id number]  Chief Complaint  Patient presents with   Follow-up    Follow up pt felt like she had UTI on and off several weeks took keflex and diflucan still feels not all the way cleared up     HPI Patient is in today for c/o mild dysuria, which has improved with Keflex, given from Urgent care for UTI.  She denies any hematuria, fever, chills, chills nausea or vomiting.  Denies any pelvic or flank pain currently.  UA was negative for LE or nitrites.  She has been taking Rybelsus for prediabetes and weight loss.  She recently started taking Rybelsus 7 mg daily.  She has been having nausea.  She is willing to switch to [facility] for weight loss.  She takes Prilosec for GERD.  Denies any dysphagia or odynophagia.    Past Medical History:  Diagnosis Date   Anxiety    Phreesia 07/08/2019   Elevated cholesterol with elevated triglycerides 10/01/2015   Encounter for gynecological examination with Papanicolaou smear of cervix 04/17/2018   Encounter for well woman exam with routine gynecological exam 02/02/2017   Hematuria 12/11/2012   Hormone replacement therapy (HRT) 02/02/2017   Hx: UTI (urinary tract infection)    Left shoulder pain    Nerve impingement   Obesity    Pityriasis rosea 04/17/2018   PMB (postmenopausal bleeding) 09/24/2015   Usually at time of changing patches    Postmenopausal HRT (hormone replacement therapy) 12/17/2012   On combi patch   Screening for colorectal cancer 04/17/2018   Urinary frequency 07/11/2013   Urinary pain 12/11/2012   UTI (urinary tract infection) 11/29/2016   +Ecoli    Past Surgical History:  Procedure Laterality Date   CHOLECYSTECTOMY N/A    Phreesia 07/08/2019   COLONOSCOPY WITH ESOPHAGOGASTRODUODENOSCOPY (EGD) N/A 10/18/2012   Procedure: COLONOSCOPY WITH ESOPHAGOGASTRODUODENOSCOPY (EGD);  Surgeon: Daneil Dolin, MD;  Location[facility];  Service: Endoscopy;  Laterality: N/A;  9:45 AM   LAPAROSCOPIC CHOLECYSTECTOMY      Family History  Problem Relation Age of Onset   Hyperlipidemia Mother    Hypertension Mother    GER disease Mother    Heart disease Father        atrial fib   Hypertension Father    Arthritis Father        RA   Heart disease Paternal Grandmother        CHF   Hyperlipidemia Maternal Grandmother    Kidney disease Brother        kidney stones   Other Sister        precancerous breast lesion   Kidney disease Brother        kidney stones   Colon cancer Neg Hx     Social History   Socioeconomic History   Marital status: Married    Spouse name: Jeneen Rinks    Number of children: 2   Years of education: 16   Highest education level: Bachelor's degree (e.g., BA, AB, BS)  Occupational History   Not on file  Tobacco Use   Smoking status: Never   Smokeless tobacco: Never  Vaping Use   Vaping Use: Never used  Substance and Sexual Activity   Alcohol use: Yes    Comment: occasional wine   Drug use: No   Sexual activity: Yes    Birth control/protection: Post-menopausal  Other Topics Concern   Not on file  Social History Narrative   Lives with husband, Jeneen Rinks, married 9 years.      Blended family: each had two.   Two daughters   He had a daughter and son      Enjoys traveling: within [place], [place], [place]       Sunscreen and seat belt wearer   Well-balanced diet   Drinks water- does enjoy diet coke      Does take vitamin D and C         Social Determinants of Radio broadcast assistant Strain: Not on file  Food Insecurity: Not on file  Transportation Needs: Not on file  Physical Activity: Not on file  Stress: Not on file  Social Connections: Not on file  Intimate Partner Violence: Not on file    Outpatient Medications Prior to Visit  Medication Sig Dispense Refill   atorvastatin (LIPITOR) 20 MG tablet Take 1 tablet (20 mg total) by mouth daily. 90 tablet 3   CALCIUM  PO Take by mouth daily.     cholecalciferol (VITAMIN D) 1000 units tablet Take 5,000 Units by mouth daily.     estradiol-levonorgestrel (CLIMARAPRO) 0.045-0.015 MG/DAY PLACE 1 PATCH ONTO THE SKIN ONCE A WEEK. 4 patch 12   hydrOXYzine (VISTARIL) 25 MG capsule Take 1 capsule (25 mg total) by mouth every 8 (eight) hours as needed. 30 capsule 0   ibuprofen (ADVIL,MOTRIN) 200 MG tablet Take 200 mg by mouth every 6 (six) hours as needed for pain. As needed     omeprazole (PRILOSEC) 20 MG capsule Take 1 capsule (20 mg total) by mouth daily. 90 capsule 3   Semaglutide 7 MG TABS Take 7 mg by mouth every 7 (seven) days.     vitamin C (ASCORBIC ACID) 500 MG tablet Take 1,000 mg by mouth daily.      progesterone (PROMETRIUM) 200 MG capsule TAKE 1 CAPSULE (200 MG TOTAL) BY MOUTH DAILY. 90 capsule 2   Semaglutide (RYBELSUS) 7 MG TABS Take 1 tablet (7 mg total) by mouth daily. 30 tablet 0   fluconazole (DIFLUCAN) 150 MG tablet Take 1 now and 1 in 3 days (Patient not taking: Reported on 12/20/2020) 2 tablet 1   Semaglutide (RYBELSUS) 14 MG TABS Take 14 mg by mouth daily. (Patient not taking: Reported on 12/20/2020) 30 tablet 1   No facility-administered medications prior to visit.    Allergies  Allergen Reactions   Macrobid [Nitrofurantoin Monohyd Macro] Hives, Shortness Of Breath and Itching    Review of Systems  Constitutional:  Negative for chills and fever.  HENT:  Negative for congestion, sinus pressure, sinus pain and sore throat.   Eyes:  Negative for pain and discharge.  Respiratory:  Negative for cough and shortness of breath.   Cardiovascular:  Negative for chest pain and palpitations.  Gastrointestinal:  Negative for abdominal pain, constipation, diarrhea, nausea and vomiting.  Endocrine: Negative for polydipsia and polyuria.  Genitourinary:  Positive for dysuria. Negative for hematuria.  Musculoskeletal:  Negative for neck pain and neck stiffness.  Skin:  Negative for rash.  Neurological:   Negative for dizziness and weakness.  Psychiatric/Behavioral:  Negative for agitation and behavioral problems.       Objective:    Physical Exam Vitals reviewed.  Constitutional:      General: She is not in acute distress.    Appearance: She is obese. She is not diaphoretic.  HENT:     Head: Normocephalic  and atraumatic.     Nose: Nose normal.     Mouth/Throat:     Mouth: Mucous membranes are moist.  Eyes:     General: No scleral icterus.    Extraocular Movements: Extraocular movements intact.  Cardiovascular:     Rate and Rhythm: Normal rate and regular rhythm.     Pulses: Normal pulses.     Heart sounds: Normal heart sounds. No murmur heard. Pulmonary:     Breath sounds: Normal breath sounds. No wheezing or rales.  Abdominal:     Palpations: Abdomen is soft.     Tenderness: There is no abdominal tenderness.  Musculoskeletal:     Cervical back: Neck supple. No tenderness.     Right lower leg: No edema.     Left lower leg: No edema.  Skin:    General: Skin is warm.     Findings: No rash.  Neurological:     General: No focal deficit present.     Mental Status: She is alert and oriented to person, place, and time.  Psychiatric:        Mood and Affect: Mood normal.        Behavior: Behavior normal.    BP 127/90 (BP Location: Right Arm, Patient Position: Sitting, Cuff Size: Normal)   Pulse 75   Temp 97.8 F (36.6 C) (Oral)   Ht 5' 5.5" (1.664 m)   Wt 232 lb (105.2 kg)   SpO2 98%   BMI 38.02 kg/m  Wt Readings from Last 3 Encounters:  12/20/20 232 lb (105.2 kg)  09/01/20 241 lb (109.3 kg)  02/24/20 225 lb (102.1 kg)        Assessment & Plan:   Problem List Items Addressed This Visit       Digestive   GERD (gastroesophageal reflux disease)    Takes Prilosec        Other   Obesity (BMI 35.0-39.9 without comorbidity) - Primary    BMI Readings from Last 3 Encounters:  12/20/20 38.02 kg/m  09/01/20 39.49 kg/m  02/24/20 37.44 kg/m  Diet  modification and moderate exercise advised On Rybelsus 7 mg QD Switch to Wegovy, plan to increase dose as tolerated       Relevant Medications   Semaglutide-Weight Management (WEGOVY) 0.5 MG/0.5ML SOAJ   Semaglutide 7 MG TABS   HLD (hyperlipidemia)    On atorvastatin 20 mg daily Diet modification Check lipid profile      Relevant Orders   Lipid panel   Anxiety    Well controlled with Vistaril as needed      Prediabetes    On Rybelsus for additional weight loss benefit      Relevant Orders   CMP14+EGFR   HgB A1c   Other Visit Diagnoses     Dysuria     Completed Keflex for UTI UA reviewed Advised to improve fluid intake Azo as needed   Relevant Orders   POCT URINALYSIS DIP (CLINITEK) (Completed)        Meds ordered this encounter  Medications   Semaglutide-Weight Management (WEGOVY) 0.5 MG/0.5ML SOAJ    Sig: Inject 0.5 mg into the skin every 7 (seven) days.    Dispense:  2 mL    Refill:  0     Edwardine Deschepper Keith Rake, MD

## 2020-12-24 NOTE — Assessment & Plan Note (Signed)
BMI Readings from Last 3 Encounters:  12/20/20 38.02 kg/m  09/01/20 39.49 kg/m  02/24/20 37.44 kg/m   Diet modification and moderate exercise advised On Rybelsus 7 mg QD Switch to Wegovy, plan to increase dose as tolerated

## 2020-12-24 NOTE — Assessment & Plan Note (Signed)
On atorvastatin 20 mg daily Diet modification Check lipid profile

## 2020-12-24 NOTE — Assessment & Plan Note (Signed)
On Rybelsus for additional weight loss benefit

## 2020-12-28 ENCOUNTER — Other Ambulatory Visit: Payer: Self-pay | Admitting: Internal Medicine

## 2020-12-28 ENCOUNTER — Other Ambulatory Visit (HOSPITAL_COMMUNITY): Payer: Self-pay

## 2020-12-28 ENCOUNTER — Encounter: Payer: Self-pay | Admitting: Internal Medicine

## 2020-12-28 DIAGNOSIS — E669 Obesity, unspecified: Secondary | ICD-10-CM

## 2020-12-28 DIAGNOSIS — R7303 Prediabetes: Secondary | ICD-10-CM

## 2020-12-28 MED ORDER — SEMAGLUTIDE 7 MG PO TABS
7.0000 mg | ORAL_TABLET | ORAL | 1 refills | Status: DC
  Filled 2020-12-28: qty 30, fill #0

## 2020-12-28 MED ORDER — RYBELSUS 7 MG PO TABS
1.0000 | ORAL_TABLET | Freq: Every day | ORAL | 1 refills | Status: DC
Start: 2020-12-28 — End: 2021-03-31
  Filled 2020-12-28: qty 30, 30d supply, fill #0
  Filled 2021-02-10: qty 30, 30d supply, fill #1

## 2020-12-29 ENCOUNTER — Other Ambulatory Visit (HOSPITAL_COMMUNITY): Payer: Self-pay

## 2021-01-08 DIAGNOSIS — H5203 Hypermetropia, bilateral: Secondary | ICD-10-CM | POA: Diagnosis not present

## 2021-01-13 ENCOUNTER — Other Ambulatory Visit (HOSPITAL_COMMUNITY): Payer: Self-pay

## 2021-01-13 ENCOUNTER — Ambulatory Visit (INDEPENDENT_AMBULATORY_CARE_PROVIDER_SITE_OTHER): Payer: 59 | Admitting: Internal Medicine

## 2021-01-13 ENCOUNTER — Other Ambulatory Visit: Payer: Self-pay

## 2021-01-13 ENCOUNTER — Encounter: Payer: Self-pay | Admitting: Internal Medicine

## 2021-01-13 VITALS — BP 138/84 | HR 90 | Resp 18 | Ht 65.5 in | Wt 229.1 lb

## 2021-01-13 DIAGNOSIS — E010 Iodine-deficiency related diffuse (endemic) goiter: Secondary | ICD-10-CM | POA: Diagnosis not present

## 2021-01-13 DIAGNOSIS — E041 Nontoxic single thyroid nodule: Secondary | ICD-10-CM | POA: Insufficient documentation

## 2021-01-13 MED FILL — Estradiol-Levonorgestrel TD Patch Weekly 0.045-0.015 MG/DAY: TRANSDERMAL | 28 days supply | Qty: 4 | Fill #7 | Status: AC

## 2021-01-13 NOTE — Assessment & Plan Note (Signed)
Check US thyroid for possible nodules TSH WNL If US thyroid abnormal, will repeat TSH and add free T4

## 2021-01-13 NOTE — Progress Notes (Signed)
Acute Office Visit  Subjective:    Patient ID: Amanda Higgins, female    DOB: 28-Feb-1960, 60 y.o.   MRN: [id number]  Chief Complaint  Patient presents with   Acute Visit    Pt noticed last week that the bottom of her neck is swollen doesn't hurt would like to have thyroid checked     HPI Patient is in today for c/o a bump noticed on the lower part of her neck about a week ago, which is not painful.  She denies any dysphagia or dysphonia.  Her last TSH was WNL.  Denies any recent change in appetite.  She has lost about 3 pounds since the last visit, which is intentional.  Denies any tremors, dyspnea or palpitations.  Denies any LE swelling.  Past Medical History:  Diagnosis Date   Anxiety    Phreesia 07/08/2019   Elevated cholesterol with elevated triglycerides 10/01/2015   Encounter for gynecological examination with Papanicolaou smear of cervix 04/17/2018   Encounter for well woman exam with routine gynecological exam 02/02/2017   Hematuria 12/11/2012   Hormone replacement therapy (HRT) 02/02/2017   Hx: UTI (urinary tract infection)    Left shoulder pain    Nerve impingement   Obesity    Pityriasis rosea 04/17/2018   PMB (postmenopausal bleeding) 09/24/2015   Usually at time of changing patches    Postmenopausal HRT (hormone replacement therapy) 12/17/2012   On combi patch   Screening for colorectal cancer 04/17/2018   Urinary frequency 07/11/2013   Urinary pain 12/11/2012   UTI (urinary tract infection) 11/29/2016   +Ecoli    Past Surgical History:  Procedure Laterality Date   CHOLECYSTECTOMY N/A    Phreesia 07/08/2019   COLONOSCOPY WITH ESOPHAGOGASTRODUODENOSCOPY (EGD) N/A 10/18/2012   Procedure: COLONOSCOPY WITH ESOPHAGOGASTRODUODENOSCOPY (EGD);  Surgeon: Daneil Dolin, MD;  Location: [facility];  Service: Endoscopy;  Laterality: N/A;  9:45 AM   LAPAROSCOPIC CHOLECYSTECTOMY      Family History  Problem Relation Age of Onset   Hyperlipidemia Mother    Hypertension  Mother    GER disease Mother    Heart disease Father        atrial fib   Hypertension Father    Arthritis Father        RA   Heart disease Paternal Grandmother        CHF   Hyperlipidemia Maternal Grandmother    Kidney disease Brother        kidney stones   Other Sister        precancerous breast lesion   Kidney disease Brother        kidney stones   Colon cancer Neg Hx     Social History   Socioeconomic History   Marital status: Married    Spouse name: Jeneen Rinks    Number of children: 2   Years of education: 16   Highest education level: Bachelor's degree (e.g., BA, AB, BS)  Occupational History   Not on file  Tobacco Use   Smoking status: Never   Smokeless tobacco: Never  Vaping Use   Vaping Use: Never used  Substance and Sexual Activity   Alcohol use: Yes    Comment: occasional wine   Drug use: No   Sexual activity: Yes    Birth control/protection: Post-menopausal  Other Topics Concern   Not on file  Social History Narrative   Lives with husband, Jeneen Rinks, married 9 years.      Blended family: each had  two.   Two daughters   He had a daughter and son      Enjo[place] traveling: [place], [place], [place]       Sunscreen and seat belt wearer   Well-balanced diet   Drinks water- does enjoy diet coke      Does take vitamin D and C         Social Determinants of Radio broadcast assistant Strain: Not on file  Food Insecurity: Not on file  Transportation Needs: Not on file  Physical Activity: Not on file  Stress: Not on file  Social Connections: Not on file  Intimate Partner Violence: Not on file    Outpatient Medications Prior to Visit  Medication Sig Dispense Refill   atorvastatin (LIPITOR) 20 MG tablet Take 1 tablet (20 mg total) by mouth daily. 90 tablet 3   CALCIUM PO Take by mouth daily.     cholecalciferol (VITAMIN D) 1000 units tablet Take 5,000 Units by mouth daily.     estradiol-levonorgestrel (CLIMARAPRO) 0.045-0.015 MG/DAY PLACE 1 PATCH  ONTO THE SKIN ONCE A WEEK. 4 patch 12   hydrOXYzine (VISTARIL) 25 MG capsule Take 1 capsule (25 mg total) by mouth every 8 (eight) hours as needed. 30 capsule 0   ibuprofen (ADVIL,MOTRIN) 200 MG tablet Take 200 mg by mouth every 6 (six) hours as needed for pain. As needed     omeprazole (PRILOSEC) 20 MG capsule Take 1 capsule (20 mg total) by mouth daily. 90 capsule 3   progesterone (PROMETRIUM) 200 MG capsule Take 1 capsule (200 mg total) by mouth daily. 90 capsule 2   Semaglutide (RYBELSUS) 7 MG TABS Take 1 tablet by mouth daily. 30 tablet 1   Semaglutide-Weight Management (WEGOVY) 0.5 MG/0.5ML SOAJ Inject 0.5 mg into the skin every 7 (seven) days. 2 mL 0   vitamin C (ASCORBIC ACID) 500 MG tablet Take 1,000 mg by mouth daily.      No facility-administered medications prior to visit.    Allergies  Allergen Reactions   Macrobid [Nitrofurantoin Monohyd Macro] Hives, Shortness Of Breath and Itching    Review of Systems  Constitutional:  Negative for chills and fever.  HENT:  Negative for congestion, sinus pressure, sinus pain and sore throat.   Eyes:  Negative for pain and discharge.  Respiratory:  Negative for cough and shortness of breath.   Cardiovascular:  Negative for chest pain and palpitations.  Gastrointestinal:  Negative for abdominal pain, constipation, diarrhea, nausea and vomiting.  Endocrine: Negative for polydipsia and polyuria.  Genitourinary:  Negative for dysuria and hematuria.  Musculoskeletal:  Negative for neck pain and neck stiffness.  Skin:  Negative for rash.  Neurological:  Negative for dizziness and weakness.  Psychiatric/Behavioral:  Negative for agitation and behavioral problems.       Objective:    Physical Exam Vitals reviewed.  Constitutional:      General: She is not in acute distress.    Appearance: She is obese. She is not diaphoretic.  HENT:     Head: Normocephalic and atraumatic.     Nose: Nose normal.     Mouth/Throat:     Mouth: Mucous  membranes are moist.  Eyes:     General: No scleral icterus.    Extraocular Movements: Extraocular movements intact.  Neck:     Thyroid: Thyromegaly present. No thyroid tenderness.  Cardiovascular:     Rate and Rhythm: Normal rate and regular rhythm.     Pulses: Normal pulses.  Heart sounds: Normal heart sounds. No murmur heard. Pulmonary:     Breath sounds: Normal breath sounds. No wheezing or rales.  Musculoskeletal:     Cervical back: Neck supple. No tenderness.     Right lower leg: No edema.     Left lower leg: No edema.  Skin:    General: Skin is warm.     Findings: No rash.  Neurological:     General: No focal deficit present.     Mental Status: She is alert and oriented to person, place, and time.  Psychiatric:        Mood and Affect: Mood normal.        Behavior: Behavior normal.    BP 138/84 (BP Location: Left Arm, Patient Position: Sitting, Cuff Size: Normal)   Pulse 90   Resp 18   Ht 5' 5.5" (1.664 m)   Wt 229 lb 1.3 oz (103.9 kg)   SpO2 99%   BMI 37.54 kg/m  Wt Readings from Last 3 Encounters:  01/13/21 229 lb 1.3 oz (103.9 kg)  12/20/20 232 lb (105.2 kg)  09/01/20 241 lb (109.3 kg)        Assessment & Plan:   Problem List Items Addressed This Visit       Endocrine   Thyromegaly - Primary    Check US thyroid for possible nodules TSH WNL If US thyroid abnormal, will repeat TSH and add free T4      Relevant Orders   US THYROID     No orders of the defined types were placed in this encounter.    Lindell Spar, MD

## 2021-01-13 NOTE — Patient Instructions (Addendum)
Please get [place] of thyroid done at [facility]

## 2021-01-18 ENCOUNTER — Ambulatory Visit (HOSPITAL_COMMUNITY)
Admission: RE | Admit: 2021-01-18 | Discharge: 2021-01-18 | Disposition: A | Discharge status: Home / Self Care | Payer: 59 | Source: Ambulatory Visit | Attending: Internal Medicine | Admitting: Internal Medicine

## 2021-01-18 ENCOUNTER — Other Ambulatory Visit: Payer: Self-pay

## 2021-01-18 DIAGNOSIS — E042 Nontoxic multinodular goiter: Secondary | ICD-10-CM | POA: Diagnosis not present

## 2021-01-18 DIAGNOSIS — E010 Iodine-deficiency related diffuse (endemic) goiter: Secondary | ICD-10-CM | POA: Diagnosis not present

## 2021-01-19 ENCOUNTER — Other Ambulatory Visit: Payer: Self-pay | Admitting: *Deleted

## 2021-01-19 DIAGNOSIS — R1310 Dysphagia, unspecified: Secondary | ICD-10-CM

## 2021-01-20 ENCOUNTER — Ambulatory Visit: Payer: 59 | Admitting: Internal Medicine

## 2021-02-02 ENCOUNTER — Other Ambulatory Visit (HOSPITAL_BASED_OUTPATIENT_CLINIC_OR_DEPARTMENT_OTHER): Payer: Self-pay

## 2021-02-10 ENCOUNTER — Other Ambulatory Visit (HOSPITAL_COMMUNITY): Payer: Self-pay

## 2021-02-10 ENCOUNTER — Encounter: Payer: Self-pay | Admitting: Internal Medicine

## 2021-02-14 ENCOUNTER — Encounter: Payer: Self-pay | Admitting: Internal Medicine

## 2021-02-17 ENCOUNTER — Other Ambulatory Visit (HOSPITAL_COMMUNITY): Payer: Self-pay

## 2021-02-17 ENCOUNTER — Encounter: Payer: Self-pay | Admitting: Internal Medicine

## 2021-02-18 ENCOUNTER — Other Ambulatory Visit (HOSPITAL_COMMUNITY): Payer: Self-pay

## 2021-02-21 NOTE — Telephone Encounter (Signed)
Dr Burney Gauze office is closed for the holiday --will give pt number

## 2021-03-12 ENCOUNTER — Telehealth: Payer: 59 | Admitting: Nurse Practitioner

## 2021-03-12 DIAGNOSIS — N3000 Acute cystitis without hematuria: Secondary | ICD-10-CM

## 2021-03-12 MED ORDER — CEPHALEXIN 500 MG PO CAPS: 500.0000 mg | ORAL_CAPSULE | Freq: Two times a day (BID) | ORAL | 0 refills | Status: DC

## 2021-03-12 NOTE — Addendum Note (Signed)
Addended by: Chevis Pretty on: 03/12/2021 09:50 AM   Modules accepted: Orders

## 2021-03-12 NOTE — Progress Notes (Signed)

## 2021-03-17 ENCOUNTER — Other Ambulatory Visit: Payer: Self-pay | Admitting: Nurse Practitioner

## 2021-03-17 ENCOUNTER — Encounter: Payer: Self-pay | Admitting: Internal Medicine

## 2021-03-17 ENCOUNTER — Other Ambulatory Visit (HOSPITAL_COMMUNITY): Payer: Self-pay

## 2021-03-17 DIAGNOSIS — E669 Obesity, unspecified: Secondary | ICD-10-CM

## 2021-03-17 MED ORDER — WEGOVY 1 MG/0.5ML ~~LOC~~ SOAJ
1.0000 mg | SUBCUTANEOUS | 0 refills | Status: DC
  Filled 2021-03-17 – 2021-03-24 (×4): qty 2, 28d supply, fill #0

## 2021-03-22 ENCOUNTER — Encounter: Payer: Self-pay | Admitting: Internal Medicine

## 2021-03-22 ENCOUNTER — Telehealth: Payer: Self-pay | Admitting: Adult Health

## 2021-03-22 ENCOUNTER — Other Ambulatory Visit (HOSPITAL_COMMUNITY): Payer: Self-pay

## 2021-03-22 DIAGNOSIS — E782 Mixed hyperlipidemia: Secondary | ICD-10-CM | POA: Diagnosis not present

## 2021-03-22 DIAGNOSIS — R7303 Prediabetes: Secondary | ICD-10-CM | POA: Diagnosis not present

## 2021-03-22 MED ORDER — SULFAMETHOXAZOLE-TRIMETHOPRIM 800-160 MG PO TABS: 1.0000 | ORAL_TABLET | Freq: Two times a day (BID) | ORAL | 0 refills | Status: DC

## 2021-03-22 NOTE — Telephone Encounter (Signed)
Complaining of UTI symptoms, will rx septra ds

## 2021-03-23 ENCOUNTER — Other Ambulatory Visit (HOSPITAL_COMMUNITY): Payer: Self-pay

## 2021-03-23 LAB — CMP14+EGFR
ALT: 30 IU/L (ref 0–32)
AST: 23 IU/L (ref 0–40)
Albumin/Globulin Ratio: 1.6 (ref 1.2–2.2)
Albumin: 4.2 g/dL (ref 3.8–4.8)
Alkaline Phosphatase: 112 IU/L (ref 44–121)
BUN/Creatinine Ratio: 15 (ref 12–28)
BUN: 12 mg/dL (ref 8–27)
Bilirubin Total: 0.5 mg/dL (ref 0.0–1.2)
CO2: 26 mmol/L (ref 20–29)
Calcium: 9.8 mg/dL (ref 8.7–10.3)
Chloride: 101 mmol/L (ref 96–106)
Creatinine, Ser: 0.79 mg/dL (ref 0.57–1.00)
Globulin, Total: 2.6 g/dL (ref 1.5–4.5)
Glucose: 96 mg/dL (ref 70–99)
Potassium: 4.6 mmol/L (ref 3.5–5.2)
Sodium: 140 mmol/L (ref 134–144)
Total Protein: 6.8 g/dL (ref 6.0–8.5)
eGFR: 85 mL/min/{1.73_m2} (ref >59)

## 2021-03-23 LAB — LIPID PANEL
Chol/HDL Ratio: 5.1 ratio — ABNORMAL HIGH (ref 0.0–4.4)
Cholesterol, Total: 189 mg/dL (ref 100–199)
HDL: 37 mg/dL — ABNORMAL LOW (ref >39)
LDL Chol Calc (NIH): 128 mg/dL — ABNORMAL HIGH (ref 0–99)
Triglycerides: 135 mg/dL (ref 0–149)
VLDL Cholesterol Cal: 24 mg/dL (ref 5–40)

## 2021-03-23 LAB — HEMOGLOBIN A1C
Est. average glucose Bld gHb Est-mCnc: 111 mg/dL
Hgb A1c MFr Bld: 5.5 % (ref 4.8–5.6)

## 2021-03-24 ENCOUNTER — Other Ambulatory Visit (HOSPITAL_COMMUNITY): Payer: Self-pay

## 2021-03-28 ENCOUNTER — Other Ambulatory Visit (HOSPITAL_COMMUNITY): Payer: Self-pay

## 2021-03-28 ENCOUNTER — Other Ambulatory Visit: Payer: Self-pay

## 2021-03-28 ENCOUNTER — Ambulatory Visit (INDEPENDENT_AMBULATORY_CARE_PROVIDER_SITE_OTHER): Payer: 59 | Admitting: Internal Medicine

## 2021-03-28 ENCOUNTER — Encounter: Payer: Self-pay | Admitting: Internal Medicine

## 2021-03-28 VITALS — BP 118/84 | HR 83 | Resp 18 | Ht 65.5 in | Wt 225.1 lb

## 2021-03-28 DIAGNOSIS — E669 Obesity, unspecified: Secondary | ICD-10-CM

## 2021-03-28 DIAGNOSIS — K219 Gastro-esophageal reflux disease without esophagitis: Secondary | ICD-10-CM | POA: Diagnosis not present

## 2021-03-28 MED ORDER — SEMAGLUTIDE-WEIGHT MANAGEMENT 1.7 MG/0.75ML ~~LOC~~ SOAJ
1.7000 mg | SUBCUTANEOUS | 0 refills | Status: AC
  Filled 2021-03-28 – 2021-04-14 (×2): qty 3, 28d supply, fill #0

## 2021-03-28 MED ORDER — SEMAGLUTIDE-WEIGHT MANAGEMENT 2.4 MG/0.75ML ~~LOC~~ SOAJ
2.4000 mg | SUBCUTANEOUS | 3 refills | Status: DC
  Filled 2021-03-28: qty 3, fill #0
  Filled 2021-05-23: qty 3, 28d supply, fill #0
  Filled 2021-06-20 – 2021-06-22 (×4): qty 3, 28d supply, fill #1
  Filled 2021-07-17: qty 3, 28d supply, fill #2
  Filled 2021-08-14: qty 3, 28d supply, fill #3

## 2021-03-28 NOTE — Patient Instructions (Addendum)
Please continue taking Wegovy as prescribed.  Please continue to follow low carb diet and perform moderate exercise/walking at least 150 mins/week.  Please start taking Hair, nail and skin multivitamin.

## 2021-03-31 NOTE — Assessment & Plan Note (Signed)
Well controlled with Prilosec 

## 2021-03-31 NOTE — Assessment & Plan Note (Signed)
BMI Readings from Last 3 Encounters:  03/28/21 36.89 kg/m  01/13/21 37.54 kg/m  12/20/20 38.02 kg/m   Initial BMI - 37.54 when Wegovy started Diet modification and moderate exercise advised On Wegovy, plan to increase dose as tolerated

## 2021-03-31 NOTE — Progress Notes (Signed)
Established Patient Office Visit  Subjective:  Patient ID: Amanda Higgins, female    DOB: [birth year]  Age: 61 y.o. MRN: [id number]  CC:  Chief Complaint  Patient presents with   Follow-up    3 month follow up medication is working good     HPI Amanda Higgins is a 61 y.o. female with past medical history of GAD, GERD and obesity who presents for f/u of her chronic medical conditions.  She has started using [facility] for weight loss.  She has been tolerating it well.  She has mild nausea, but is manageable currently.  She has lost about 7 pounds since 11/22, but she was on Rybelsus at that time.  She has started [facility] only for the last 6 weeks.  She takes omeprazole as needed for GERD.  Denies any dysphagia or odynophagia currently.    Past Medical History:  Diagnosis Date   Anxiety    Phreesia 07/08/2019   Elevated cholesterol with elevated triglycerides 10/01/2015   Encounter for gynecological examination with Papanicolaou smear of cervix 04/17/2018   Encounter for well woman exam with routine gynecological exam 02/02/2017   Hematuria 12/11/2012   Hormone replacement therapy (HRT) 02/02/2017   Hx: UTI (urinary tract infection)    Left shoulder pain    Nerve impingement   Obesity    Pityriasis rosea 04/17/2018   PMB (postmenopausal bleeding) 09/24/2015   Usually at time of changing patches    Postmenopausal HRT (hormone replacement therapy) 12/17/2012   On combi patch   Screening for colorectal cancer 04/17/2018   Urinary frequency 07/11/2013   Urinary pain 12/11/2012   UTI (urinary tract infection) 11/29/2016   +Ecoli    Past Surgical History:  Procedure Laterality Date   CHOLECYSTECTOMY N/A    Phreesia 07/08/2019   COLONOSCOPY WITH ESOPHAGOGASTRODUODENOSCOPY (EGD) N/A 10/18/2012   Procedure: COLONOSCOPY WITH ESOPHAGOGASTRODUODENOSCOPY (EGD);  Surgeon: Daneil Dolin, MD;  Location: [facility];  Service: Endoscopy;  Laterality: N/A;  9:45 AM   LAPAROSCOPIC  CHOLECYSTECTOMY      Family History  Problem Relation Age of Onset   Hyperlipidemia Mother    Hypertension Mother    GER disease Mother    Heart disease Father        atrial fib   Hypertension Father    Arthritis Father        RA   Heart disease Paternal Grandmother        CHF   Hyperlipidemia Maternal Grandmother    Kidney disease Brother        kidney stones   Other Sister        precancerous breast lesion   Kidney disease Brother        kidney stones   Colon cancer Neg Hx     Social History   Socioeconomic History   Marital status: Married    Spouse name: Jeneen Rinks    Number of children: 2   Years of education: 16   Highest education level: Bachelor's degree (e.g., BA, AB, BS)  Occupational History   Not on file  Tobacco Use   Smoking status: Never   Smokeless tobacco: Never  Vaping Use   Vaping Use: Never used  Substance and Sexual Activity   Alcohol use: Yes    Comment: occasional wine   Drug use: No   Sexual activity: Yes    Birth control/protection: Post-menopausal  Other Topics Concern   Not on file  Social History Narrative   Lives with  husband, Jeneen Rinks, married 9 years.      Blended family: each had two.   Two daughters   He had a daughter and son      Enjoys traveling: within [place], [place], [place]       Sunscreen and seat belt wearer   Well-balanced diet   Drinks water- does enjoy diet coke      Does take vitamin D and C         Social Determinants of Radio broadcast assistant Strain: Not on file  Food Insecurity: Not on file  Transportation Needs: Not on file  Physical Activity: Not on file  Stress: Not on file  Social Connections: Not on file  Intimate Partner Violence: Not on file    Outpatient Medications Prior to Visit  Medication Sig Dispense Refill   CALCIUM PO Take by mouth daily.     cholecalciferol (VITAMIN D) 1000 units tablet Take 5,000 Units by mouth daily.     hydrOXYzine (VISTARIL) 25 MG capsule Take 1 capsule  (25 mg total) by mouth every 8 (eight) hours as needed. 30 capsule 0   ibuprofen (ADVIL,MOTRIN) 200 MG tablet Take 200 mg by mouth every 6 (six) hours as needed for pain. As needed     omeprazole (PRILOSEC) 20 MG capsule Take 1 capsule (20 mg total) by mouth daily. 90 capsule 3   progesterone (PROMETRIUM) 200 MG capsule Take 1 capsule (200 mg total) by mouth daily. 90 capsule 2   Semaglutide-Weight Management (WEGOVY) 1 MG/0.5ML SOAJ Inject 1 mg into the skin once a week. 2 mL 0   sulfamethoxazole-trimethoprim (BACTRIM DS) 800-160 MG tablet Take 1 tablet by mouth 2 (two) times daily. Take 1 bid 14 tablet 0   vitamin C (ASCORBIC ACID) 500 MG tablet Take 1,000 mg by mouth daily.      atorvastatin (LIPITOR) 20 MG tablet Take 1 tablet (20 mg total) by mouth daily. 90 tablet 3   cephALEXin (KEFLEX) 500 MG capsule Take 1 capsule (500 mg total) by mouth 2 (two) times daily. 14 capsule 0   Semaglutide (RYBELSUS) 7 MG TABS Take 1 tablet by mouth daily. 30 tablet 1   estradiol-levonorgestrel (CLIMARAPRO) 0.045-0.015 MG/DAY PLACE 1 PATCH ONTO THE SKIN ONCE A WEEK. 4 patch 12   Semaglutide-Weight Management (WEGOVY) 0.5 MG/0.5ML SOAJ Inject 0.5 mg into the skin every 7 (seven) days. (Patient not taking: Reported on 03/28/2021) 2 mL 0   No facility-administered medications prior to visit.    Allergies  Allergen Reactions   Macrobid [Nitrofurantoin Monohyd Macro] Hives, Shortness Of Breath and Itching    ROS Review of Systems  Constitutional:  Negative for chills and fever.  HENT:  Negative for congestion, sinus pressure, sinus pain and sore throat.   Eyes:  Negative for pain and discharge.  Respiratory:  Negative for cough and shortness of breath.   Cardiovascular:  Negative for chest pain and palpitations.  Gastrointestinal:  Negative for abdominal pain, constipation, diarrhea, nausea and vomiting.  Endocrine: Negative for polydipsia and polyuria.  Genitourinary:  Negative for dysuria and hematuria.   Musculoskeletal:  Negative for neck pain and neck stiffness.  Skin:  Negative for rash.  Neurological:  Negative for dizziness and weakness.  Psychiatric/Behavioral:  Negative for agitation and behavioral problems.      Objective:    Physical Exam Vitals reviewed.  Constitutional:      General: She is not in acute distress.    Appearance: She is obese. She is not  diaphoretic.  HENT:     Head: Normocephalic and atraumatic.     Nose: Nose normal.     Mouth/Throat:     Mouth: Mucous membranes are moist.  Eyes:     General: No scleral icterus.    Extraocular Movements: Extraocular movements intact.  Neck:     Thyroid: Thyromegaly present. No thyroid tenderness.  Cardiovascular:     Rate and Rhythm: Normal rate and regular rhythm.     Pulses: Normal pulses.     Heart sounds: Normal heart sounds. No murmur heard. Pulmonary:     Breath sounds: Normal breath sounds. No wheezing or rales.  Musculoskeletal:     Cervical back: Neck supple. No tenderness.     Right lower leg: No edema.     Left lower leg: No edema.  Skin:    General: Skin is warm.     Findings: No rash.  Neurological:     General: No focal deficit present.     Mental Status: She is alert and oriented to person, place, and time.  Psychiatric:        Mood and Affect: Mood normal.        Behavior: Behavior normal.    BP 118/84 (BP Location: Right Arm, Patient Position: Sitting, Cuff Size: Normal)    Pulse 83    Resp 18    Ht 5' 5.5" (1.664 m)    Wt 225 lb 1.9 oz (102.1 kg)    SpO2 98%    BMI 36.89 kg/m  Wt Readings from Last 3 Encounters:  03/28/21 225 lb 1.9 oz (102.1 kg)  01/13/21 229 lb 1.3 oz (103.9 kg)  12/20/20 232 lb (105.2 kg)    Lab Results  Component Value Date   TSH 2.620 09/02/2020   Lab Results  Component Value Date   WBC 8.8 09/02/2020   HGB 14.2 09/02/2020   HCT 43.8 09/02/2020   MCV 94 09/02/2020   PLT 238 09/02/2020   Lab Results  Component Value Date   NA 140 03/22/2021   K 4.6  03/22/2021   CO2 26 03/22/2021   GLUCOSE 96 03/22/2021   BUN 12 03/22/2021   CREATININE 0.79 03/22/2021   BILITOT 0.5 03/22/2021   ALKPHOS 112 03/22/2021   AST 23 03/22/2021   ALT 30 03/22/2021   PROT 6.8 03/22/2021   ALBUMIN 4.2 03/22/2021   CALCIUM 9.8 03/22/2021   ANIONGAP 10 08/27/2019   EGFR 85 03/22/2021   Lab Results  Component Value Date   CHOL 189 03/22/2021   Lab Results  Component Value Date   HDL 37 (L) 03/22/2021   Lab Results  Component Value Date   LDLCALC 128 (H) 03/22/2021   Lab Results  Component Value Date   TRIG 135 03/22/2021   Lab Results  Component Value Date   CHOLHDL 5.1 (H) 03/22/2021   Lab Results  Component Value Date   HGBA1C 5.5 03/22/2021      Assessment & Plan:   Problem List Items Addressed This Visit       Digestive   GERD (gastroesophageal reflux disease)    Well controlled with Prilosec        Other   Obesity (BMI 35.0-39.9 without comorbidity) - Primary    BMI Readings from Last 3 Encounters:  03/28/21 36.89 kg/m  01/13/21 37.54 kg/m  12/20/20 38.02 kg/m  Initial BMI - 37.54 when Wegovy started Diet modification and moderate exercise advised On Wegovy, plan to increase dose as tolerated  Relevant Medications   Semaglutide-Weight Management 1.7 MG/0.75ML SOAJ (Start on 04/21/2021)   Semaglutide-Weight Management 2.4 MG/0.75ML SOAJ (Start on 05/19/2021)    Meds ordered this encounter  Medications   Semaglutide-Weight Management 1.7 MG/0.75ML SOAJ    Sig: Inject 1.7 mg into the skin once a week for 28 days.    Dispense:  3 mL    Refill:  0   Semaglutide-Weight Management 2.4 MG/0.75ML SOAJ    Sig: Inject 2.4 mg into the skin once a week.    Dispense:  3 mL    Refill:  3    Follow-up: Return in about 3 months (around 06/25/2021) for Weight management.    Lindell Spar, MD

## 2021-04-12 ENCOUNTER — Other Ambulatory Visit (HOSPITAL_COMMUNITY): Payer: Self-pay

## 2021-04-12 ENCOUNTER — Other Ambulatory Visit: Payer: Self-pay | Admitting: Adult Health

## 2021-04-12 MED ORDER — CLIMARA PRO 0.045-0.015 MG/DAY TD PTWK
1.0000 | MEDICATED_PATCH | TRANSDERMAL | 2 refills | Status: DC
  Filled 2021-04-12: qty 4, 28d supply, fill #0
  Filled 2021-07-17: qty 4, 28d supply, fill #1
  Filled 2021-09-19: qty 4, 28d supply, fill #2

## 2021-04-13 ENCOUNTER — Other Ambulatory Visit (HOSPITAL_COMMUNITY): Payer: Self-pay

## 2021-04-13 ENCOUNTER — Other Ambulatory Visit: Payer: Self-pay

## 2021-04-13 ENCOUNTER — Ambulatory Visit (INDEPENDENT_AMBULATORY_CARE_PROVIDER_SITE_OTHER): Payer: Self-pay | Admitting: *Deleted

## 2021-04-13 VITALS — Ht 65.0 in | Wt 220.0 lb

## 2021-04-13 DIAGNOSIS — Z1211 Encounter for screening for malignant neoplasm of colon: Secondary | ICD-10-CM

## 2021-04-13 MED ORDER — CLENPIQ 10-3.5-12 MG-GM -GM/160ML PO SOLN
1.0000 | Freq: Once | ORAL | 0 refills | Status: AC
  Filled 2021-04-13: qty 320, 1d supply, fill #0

## 2021-04-13 NOTE — Progress Notes (Signed)
Pt would like to know if she needs to hold Semaglutide [facility]) prior to procedure.   ?

## 2021-04-13 NOTE — Addendum Note (Signed)
Addended by: Metro Kung on: 04/13/2021 04:15 PM ? ? Modules accepted: Orders ? ?

## 2021-04-13 NOTE — Progress Notes (Addendum)
Gastroenterology Pre-Procedure Review ? ?Request Date: 04/13/2021 ?Requesting Physician: Triage per Dr. Gala Romney, Last TCS and EGD done 10/18/2012 by Dr. Gala Romney, mild erosive reflux esophagitis, 2 cm hiatal hernia, colonic diverticulosis ? ?PATIENT REVIEW QUESTIONS: The patient responded to the following health history questions as indicated:   ? ?1. Diabetes Melitis: no ?2. Joint replacements in the past 12 months: no ?3. Major health problems in the past 3 months: no ?4. Has an artificial valve or MVP: no ?5. Has a defibrillator: no ?6. Has been advised in past to take antibiotics in advance of a procedure like teeth cleaning: no ?7. Family history of colon cancer: no  ?8. Alcohol Use: no ?9. Illicit drug Use: no ?10. History of sleep apnea: no  ?11. History of coronary artery or other vascular stents placed within the last 12 months: no ?12. History of any prior anesthesia complications: no ?13. Body mass index is 36.61 kg/m?. ?   ?MEDICATIONS & ALLERGIES:    ?Patient reports the following regarding taking any blood thinners:   ?Plavix? no ?Aspirin? no ?Coumadin? no ?Brilinta? no ?Xarelto? no ?Eliquis? no ?Pradaxa? no ?Savaysa? no ?Effient? no ? ?Patient confirms/reports the following medications:  ?Current Outpatient Medications  ?Medication Sig Dispense Refill  ? CALCIUM PO Take by mouth daily.    ? cholecalciferol (VITAMIN D) 1000 units tablet Take 1,000 Units by mouth 2 (two) times daily. Takes 2000 units daily.    ? estradiol-levonorgestrel (CLIMARA PRO) 0.045-0.015 MG/DAY PLACE 1 PATCH ONTO THE SKIN ONCE A WEEK. 4 patch 2  ? ibuprofen (ADVIL,MOTRIN) 200 MG tablet Take 200 mg by mouth as needed for pain. As needed    ? omeprazole (PRILOSEC) 20 MG capsule Take 1 capsule (20 mg total) by mouth daily. 90 capsule 3  ? progesterone (PROMETRIUM) 200 MG capsule Take 1 capsule (200 mg total) by mouth daily. 90 capsule 2  ? Semaglutide-Weight Management (WEGOVY) 1 MG/0.5ML SOAJ Inject 1 mg into the skin once a week. 2 mL 0   ? [START ON 04/21/2021] Semaglutide-Weight Management 1.7 MG/0.75ML SOAJ Inject 1.7 mg into the skin once a week for 28 days. 3 mL 0  ? [START ON 05/19/2021] Semaglutide-Weight Management 2.4 MG/0.75ML SOAJ Inject 2.4 mg into the skin once a week. 3 mL 3  ? vitamin C (ASCORBIC ACID) 500 MG tablet Take 1,000 mg by mouth daily.     ? ?No current facility-administered medications for this visit.  ? ? ?Patient confirms/reports the following allergies:  ?Allergies  ?Allergen Reactions  ? Macrobid [Nitrofurantoin [employer], Shortness Of Breath and Itching  ? ? ?No orders of the defined types were placed in this encounter. ? ? ?AUTHORIZATION INFORMATION ?Primary Insurance: [employer] [place],  [place] #: [id number],  Group #: [id number] ?Pre-Cert / Josem Kaufmann required: No, not required per Gilmore Laroche ?Pre-Cert / Auth #: Ref#: [id number] ? ?Secondary Insurance: Dyanne Iha,  [place] #: [id number] ?Pre-Cert / Josem Kaufmann required: No, not required ? ?SCHEDULE INFORMATION: ?Procedure has been scheduled as follows:  ?Date: 05/02/2021, Time: 9:45  ?Location: [facility] with Dr. Gala Romney ? ?This Gastroenterology Pre-Precedure Review Form is being routed to the following provider(s): Dr. Gala Romney ?  ?

## 2021-04-14 ENCOUNTER — Encounter: Payer: Self-pay | Admitting: *Deleted

## 2021-04-14 ENCOUNTER — Other Ambulatory Visit (HOSPITAL_COMMUNITY): Payer: Self-pay

## 2021-04-14 NOTE — Progress Notes (Signed)
Spoke to pt.  Reviewed prep instructions and medication recommendations.  Pt requested instructions to be emailed to [email or web address].  Emailed accordingly.   ?

## 2021-04-14 NOTE — Progress Notes (Signed)
Rourk, Cristopher Estimable, MD  Metro Kung, RMA ? ?Shes really on the fence 2/3;  She will be an ASA 2 thanks   ?  ?   ?Previous Messages ?  ?----- Message -----  ?From: Metro Kung, RMA  ?Sent: 04/13/2021   4:58 PM EST  ?To: Daneil Dolin, MD  ? ?Dr. Gala Romney,  ? ?Debbie and I had a spot held on an ASA 2 day.  Do I need to move her to April?  Your ASA 3 days are full for right now for March.  ? ?  ?

## 2021-04-15 ENCOUNTER — Other Ambulatory Visit: Payer: Self-pay | Admitting: *Deleted

## 2021-04-25 ENCOUNTER — Telehealth: Payer: 59 | Admitting: Emergency Medicine

## 2021-04-25 ENCOUNTER — Other Ambulatory Visit: Payer: Self-pay | Admitting: Internal Medicine

## 2021-04-25 ENCOUNTER — Other Ambulatory Visit (HOSPITAL_COMMUNITY): Payer: Self-pay

## 2021-04-25 ENCOUNTER — Encounter: Payer: Self-pay | Admitting: Internal Medicine

## 2021-04-25 DIAGNOSIS — J0110 Acute frontal sinusitis, unspecified: Secondary | ICD-10-CM

## 2021-04-25 DIAGNOSIS — J069 Acute upper respiratory infection, unspecified: Secondary | ICD-10-CM | POA: Diagnosis not present

## 2021-04-25 MED ORDER — BENZONATATE 100 MG PO CAPS: 100.0000 mg | ORAL_CAPSULE | Freq: Two times a day (BID) | ORAL | 0 refills | Status: DC | PRN

## 2021-04-25 MED ORDER — AZELASTINE HCL 0.1 % NA SOLN: 2.0000 | Freq: Two times a day (BID) | NASAL | 0 refills | Status: DC

## 2021-04-25 MED ORDER — AMOXICILLIN-POT CLAVULANATE 875-125 MG PO TABS
1.0000 | ORAL_TABLET | Freq: Two times a day (BID) | ORAL | 0 refills | Status: DC
  Filled 2021-04-25: qty 14, 7d supply, fill #0

## 2021-04-25 NOTE — Progress Notes (Signed)
E-Visit for Upper Respiratory Infection  ? ?We are sorry you are not feeling well.  Here is how we plan to help! ? ?Based on what you have shared with me, it looks like you may have a viral upper respiratory infection.  Upper respiratory infections are caused by a large number of viruses; however, rhinovirus is the most common cause.  ? ?Symptoms vary from person to person, with common symptoms including sore throat, cough, fatigue or lack of energy and feeling of general discomfort.  A low-grade fever of up to 100.4 may present, but is often uncommon.  Symptoms vary however, and are closely related to a person's age or underlying illnesses.  The most common symptoms associated with an upper respiratory infection are nasal discharge or congestion, cough, sneezing, headache and pressure in the ears and face.  These symptoms usually persist for about 3 to 10 days, but can last up to 2 weeks.  It is important to know that upper respiratory infections do not cause serious illness or complications in most cases.   ? ?Upper respiratory infections can be transmitted from person to person, with the most common method of transmission being a person's hands.  The virus is able to live on the skin and can infect other persons for up to 2 hours after direct contact.  Also, these can be transmitted when someone coughs or sneezes; thus, it is important to cover the mouth to reduce this risk.  To keep the spread of the illness at [place], good hand hygiene is very important. ? ?This is an infection that is most likely caused by a virus. There are no specific treatments other than to help you with the symptoms until the infection runs its course.  We are sorry you are not feeling well.  Here is how we plan to help! ? ? ?For nasal congestion, you may use an oral decongestants such as Mucinex D or if you have glaucoma or high blood pressure use plain Mucinex.  Saline nasal spray saline irrigation (such as with Neti Pot) can help and can  safely be used as often as needed for congestion.  For your congestion, I have prescribed Azelastine nasal spray two sprays in each nostril twice a day ? ?If you do not have a history of heart disease, hypertension, diabetes or thyroid disease, prostate/bladder issues or glaucoma, you may also use Sudafed to treat nasal congestion.  It is highly recommended that you consult with a pharmacist or your primary care physician to ensure this medication is safe for you to take.    ? ?If you have a cough, you may use cough suppressants such as Delsym and Robitussin.  If you have glaucoma or high blood pressure, you can also use Coricidin HBP.   ?For cough I have prescribed for you A prescription cough medication called Tessalon Perles 100 mg. You may take 1-2 capsules every 8 hours as needed for cough ? ?If you have a sore or scratchy throat, use a saltwater gargle- ? to ? teaspoon of salt dissolved in a 4-ounce to 8-ounce glass of warm water.  Gargle the solution for approximately 15-30 seconds and then spit.  It is important not to swallow the solution.  You can also use throat lozenges/cough drops and Chloraseptic spray to help with throat pain or discomfort.  Warm or cold liquids can also be helpful in relieving throat pain. ? ?For headache, pain or general discomfort, you can use Ibuprofen or Tylenol as directed.   ?  Some authorities believe that zinc sprays or the use of Echinacea may shorten the course of your symptoms. ? ? ?HOME CARE ?Only take medications as instructed by your medical team. ?Be sure to drink plenty of fluids. Water is fine as well as fruit juices, sodas and electrolyte beverages. You may want to stay away from caffeine or alcohol. If you are nauseated, try taking small sips of liquids. How do you know if you are getting enough fluid? Your urine should be a pale yellow or almost colorless. ?Get rest. ?Taking a steamy shower or using a humidifier may help nasal congestion and ease sore throat pain.  You can place a towel over your head and breathe in the steam from hot water coming from a faucet. ?Using a saline nasal spray works much the same way. ?Cough drops, hard candies and sore throat lozenges may ease your cough. ?Avoid close contacts especially the very young and the elderly ?Cover your mouth if you cough or sneeze ?Always remember to wash your hands.  ? ?GET HELP RIGHT AWAY IF: ?You develop worsening fever. ?If your symptoms do not improve within 10 days ?You develop yellow or green discharge from your nose over 3 days. ?You have coughing fits ?You develop a severe head ache or visual changes. ?You develop shortness of breath, difficulty breathing or start having chest pain ?Your symptoms persist after you have completed your treatment plan ? ?MAKE SURE YOU  ?Understand these instructions. ?Will watch your condition. ?Will get help right away if you are not doing well or get worse. ? ?Thank you for choosing an e-visit. ? ?Your e-visit answers were reviewed by a board certified advanced clinical practitioner to complete your personal care plan. Depending upon the condition, your plan could have included both over the counter or prescription medications. ? ?Please review your pharmacy choice. Make sure the pharmacy is open so you can pick up prescription now. If there is a problem, you may contact your provider through [employer] and have the prescription routed to another pharmacy.  Your safety is important to [place]. If you have drug allergies check your prescription carefully.  ? ?For the next 24 hours you can use MyChart to ask questions about today's visit, request a non-urgent call back, or ask for a work or school excuse. ?You will get an email in the next two days asking about your experience. I hope that your e-visit has been valuable and will speed your recovery. ? ?I have spent 5 minutes in review of e-visit questionnaire, review and updating patient chart, medical decision making and  response to patient.  ? ?Willeen Cass, PhD, FNP-BC ?  ? ? ? ?

## 2021-04-28 ENCOUNTER — Encounter: Payer: Self-pay | Admitting: Internal Medicine

## 2021-05-02 ENCOUNTER — Other Ambulatory Visit: Payer: Self-pay

## 2021-05-02 ENCOUNTER — Ambulatory Visit (HOSPITAL_BASED_OUTPATIENT_CLINIC_OR_DEPARTMENT_OTHER): Payer: 59 | Admitting: Anesthesiology

## 2021-05-02 ENCOUNTER — Ambulatory Visit (HOSPITAL_COMMUNITY): Payer: 59 | Admitting: Anesthesiology

## 2021-05-02 ENCOUNTER — Encounter (HOSPITAL_COMMUNITY): Payer: Self-pay | Admitting: Internal Medicine

## 2021-05-02 ENCOUNTER — Ambulatory Visit (HOSPITAL_COMMUNITY)
Admission: RE | Admit: 2021-05-02 | Discharge: 2021-05-02 | Disposition: A | Discharge status: Home / Self Care | Payer: 59 | Attending: Internal Medicine | Admitting: Internal Medicine

## 2021-05-02 ENCOUNTER — Encounter (HOSPITAL_COMMUNITY)
Admission: RE | Disposition: A | Discharge status: Home / Self Care | Payer: Self-pay | Source: Home / Self Care | Attending: Internal Medicine

## 2021-05-02 DIAGNOSIS — Z8719 Personal history of other diseases of the digestive system: Secondary | ICD-10-CM | POA: Insufficient documentation

## 2021-05-02 DIAGNOSIS — K219 Gastro-esophageal reflux disease without esophagitis: Secondary | ICD-10-CM | POA: Diagnosis present

## 2021-05-02 DIAGNOSIS — R12 Heartburn: Secondary | ICD-10-CM

## 2021-05-02 DIAGNOSIS — K5730 Diverticulosis of large intestine without perforation or abscess without bleeding: Secondary | ICD-10-CM | POA: Insufficient documentation

## 2021-05-02 DIAGNOSIS — K449 Diaphragmatic hernia without obstruction or gangrene: Secondary | ICD-10-CM

## 2021-05-02 DIAGNOSIS — D122 Benign neoplasm of ascending colon: Secondary | ICD-10-CM | POA: Diagnosis not present

## 2021-05-02 DIAGNOSIS — K2100 Gastro-esophageal reflux disease with esophagitis, without bleeding: Secondary | ICD-10-CM | POA: Diagnosis not present

## 2021-05-02 DIAGNOSIS — Z1211 Encounter for screening for malignant neoplasm of colon: Secondary | ICD-10-CM | POA: Insufficient documentation

## 2021-05-02 DIAGNOSIS — Z139 Encounter for screening, unspecified: Secondary | ICD-10-CM | POA: Diagnosis not present

## 2021-05-02 DIAGNOSIS — F419 Anxiety disorder, unspecified: Secondary | ICD-10-CM | POA: Diagnosis not present

## 2021-05-02 HISTORY — PX: POLYPECTOMY: SHX5525

## 2021-05-02 HISTORY — PX: ESOPHAGOGASTRODUODENOSCOPY (EGD) WITH PROPOFOL: SHX5813

## 2021-05-02 HISTORY — PX: COLONOSCOPY WITH PROPOFOL: SHX5780

## 2021-05-02 SURGERY — COLONOSCOPY WITH PROPOFOL
Anesthesia: General

## 2021-05-02 MED ORDER — PHENYLEPHRINE HCL (PRESSORS) 10 MG/ML IV SOLN
INTRAVENOUS | Status: DC | PRN
  Administered 2021-05-02 (×2): 50 ug via INTRAVENOUS

## 2021-05-02 MED ORDER — PROPOFOL 500 MG/50ML IV EMUL
INTRAVENOUS | Status: DC | PRN
  Administered 2021-05-02: 180 ug/kg/min via INTRAVENOUS

## 2021-05-02 MED ORDER — LACTATED RINGERS IV SOLN: INTRAVENOUS | Status: DC

## 2021-05-02 MED ORDER — PROPOFOL 10 MG/ML IV BOLUS
INTRAVENOUS | Status: DC | PRN
  Administered 2021-05-02: 80 mg via INTRAVENOUS

## 2021-05-02 NOTE — H&P (Signed)
$'@LOGO'L$ @ ? ? ?Primary Care Physician:  Lindell Spar, MD ?Primary Gastroenterologist:  Dr. Gala Romney ? ?Pre-Procedure History & Physical: ?HPI:  LETRICIA Higgins is a 61 y.o. female here for average risk screening colonoscopy.  Last colonoscopy 10 years ago-scattered left-sided diverticula;[facility]land Kitchen  No family history of colon cancer.  Longstanding GERD dependent on PPI therapy.  Concerned about developing Barrett's esophagus.  No dysphagia.  Desires to have a updated survey of her esophagus/upper GI tract.  History of fundal gland polyp removed previously 2 cm hiatal hernia mild erosive reflux esophagitis ? ?Past Medical History:  ?Diagnosis Date  ? Anxiety   ? Phreesia 07/08/2019  ? Elevated cholesterol with elevated triglycerides 10/01/2015  ? Encounter for gynecological examination with Papanicolaou smear of cervix 04/17/2018  ? Encounter for well woman exam with routine gynecological exam 02/02/2017  ? Hematuria 12/11/2012  ? Hormone replacement therapy (HRT) 02/02/2017  ? Hx: UTI (urinary tract infection)   ? Left shoulder pain   ? Nerve impingement  ? Obesity   ? Pityriasis rosea 04/17/2018  ? PMB (postmenopausal bleeding) 09/24/2015  ? Usually at time of changing patches   ? Postmenopausal HRT (hormone replacement therapy) 12/17/2012  ? On combi patch  ? Screening for colorectal cancer 04/17/2018  ? Urinary frequency 07/11/2013  ? Urinary pain 12/11/2012  ? UTI (urinary tract infection) 11/29/2016  ? +Ecoli  ? ? ?Past Surgical History:  ?Procedure Laterality Date  ? CHOLECYSTECTOMY N/A   ? Phreesia 07/08/2019  ? COLONOSCOPY WITH ESOPHAGOGASTRODUODENOSCOPY (EGD) N/A 10/18/2012  ? Procedure: COLONOSCOPY WITH ESOPHAGOGASTRODUODENOSCOPY (EGD);  Surgeon: Daneil Dolin, MD;  Location: [facility];  Service: Endoscopy;  Laterality: N/A;  9:45 AM  ? LAPAROSCOPIC CHOLECYSTECTOMY    ? ? ?Prior to Admission medications   ?Medication Sig Start Date End Date Taking? Authorizing Provider  ?acetaminophen (TYLENOL) 500 MG tablet Take 1,000 mg  by mouth every 8 (eight) hours as needed for moderate pain.   Yes [provider]  ?amoxicillin-clavulanate (AUGMENTIN) 875-125 MG tablet Take 1 tablet by mouth 2 (two) times daily. 04/25/21  Yes Lindell Spar, MD  ?Ascorbic Acid (VITAMIN C) 1000 MG tablet Take 1,000 mg by mouth daily.    Yes [provider]  ?azelastine (ASTELIN) 0.1 % nasal spray Place 2 sprays into both nostrils 2 (two) times daily. Use in each nostril as directed 04/25/21  Yes Kabbe, Dionne Bucy, NP  ?benzonatate (TESSALON) 100 MG capsule Take 1 capsule (100 mg total) by mouth 2 (two) times daily as needed for cough. 04/25/21  Yes Carvel Getting, NP  ?Biotin 10000 MCG TABS Take 10,000 mcg by mouth at bedtime.   Yes [provider]  ?CALCIUM PO Take 1 tablet by mouth daily.   Yes [provider]  ?cholecalciferol (VITAMIN D) 1000 units tablet Take 1,000 Units by mouth 2 (two) times daily.   Yes [provider]  ?estradiol-levonorgestrel (CLIMARA PRO) 0.045-0.015 MG/DAY PLACE 1 PATCH ONTO THE SKIN ONCE A WEEK. 04/12/21 04/12/22 Yes Estill Dooms, NP  ?ibuprofen (ADVIL,MOTRIN) 200 MG tablet Take 600 mg by mouth every 8 (eight) hours as needed for headache or moderate pain.   Yes [provider]  ?omeprazole (PRILOSEC) 20 MG capsule Take 1 capsule (20 mg total) by mouth daily. 09/01/20  Yes Noreene Larsson, NP  ?Prenatal Vit-Fe Fumarate-FA (PRENATAL MULTIVITAMIN) TABS tablet Take 1 tablet by mouth at bedtime.   Yes [provider]  ?progesterone (PROMETRIUM) 200 MG capsule Take 1 capsule (  200 mg total) by mouth daily. ?Patient taking differently: Take 200 mg by mouth at bedtime. 12/23/20  Yes Estill Dooms, NP  ?Semaglutide-Weight Management (WEGOVY) 1 MG/0.5ML SOAJ Inject 1 mg into the skin once a week. 03/17/21  Yes Lindell Spar, MD  ?Semaglutide-Weight Management 1.7 MG/0.75ML SOAJ Inject 1.7 mg into the skin once a week for 28 days. 04/21/21 05/19/21 Yes Lindell Spar, MD  ?zinc  gluconate 50 MG tablet Take 50 mg by mouth daily.   Yes [provider]  ?Semaglutide-Weight Management 2.4 MG/0.75ML SOAJ Inject 2.4 mg into the skin once a week. 05/19/21   Lindell Spar, MD  ? ? ?Allergies as of 04/14/2021 - Review Complete 04/13/2021  ?Allergen Reaction Noted  ? Macrobid [nitrofurantoin monohyd macro] Hives, Shortness Of Breath, and Itching 09/24/2015  ? ? ?Family History  ?Problem Relation Age of Onset  ? Hyperlipidemia Mother   ? Hypertension Mother   ? GER disease Mother   ? Heart disease Father   ?     atrial fib  ? Hypertension Father   ? Arthritis Father   ?     RA  ? Heart disease Paternal Grandmother   ?     CHF  ? Hyperlipidemia Maternal Grandmother   ? Kidney disease Brother   ?     kidney stones  ? Other Sister   ?     precancerous breast lesion  ? Kidney disease Brother   ?     kidney stones  ? Colon cancer Neg Hx   ? ? ?Social History  ? ?Socioeconomic History  ? Marital status: Married  ?  Spouse name: Jeneen Rinks   ? Number of children: 2  ? Years of education: 71  ? Highest education level: Bachelor's degree (e.g., BA, AB, BS)  ?Occupational History  ? Not on file  ?Tobacco Use  ? Smoking status: Never  ? Smokeless tobacco: Never  ?Vaping Use  ? Vaping Use: Never used  ?Substance and Sexual Activity  ? Alcohol use: Yes  ?  Comment: occasional wine  ? Drug use: No  ? Sexual activity: Yes  ?  Birth control/protection: Post-menopausal  ?Other Topics Concern  ? Not on file  ?Social History Narrative  ? Lives with husband, Jeneen Rinks, married 9 years.  ?   ? Blended family: each had two.  ? Two daughters  ? He had a daughter and son  ?   ? Enjoys traveling: within [place], [place], [place]   ?   ? Sunscreen and seat belt wearer  ? Well-balanced diet  ? Drinks water- does enjoy diet coke  ?   ? Does take vitamin D and C  ?   ?   ? ?Social Determinants of Health  ? ?Financial Resource Strain: Not on file  ?Food Insecurity: Not on file  ?Transportation Needs: Not on file  ?Physical  Activity: Not on file  ?Stress: Not on file  ?Social Connections: Not on file  ?Intimate Partner Violence: Not on file  ? ? ?Review of Systems: ?See HPI, otherwise negative ROS ? ?Physical Exam: ?BP (!) 145/79   Pulse 99   Temp 99.1 ?F (37.3 ?C) (Oral)   Resp 13   Ht '5\' 5"'$  (1.651 m)   Wt 97.5 kg   SpO2 97%   BMI 35.78 kg/m?  ?General:   Alert,  Well-developed, well-nourished, pleasant and cooperative in NAD ?Mouth:  No deformity or lesions. ?Neck:  Supple; no masses or thyromegaly.  No significant cervical adenopathy. ?Lungs:  Clear throughout to auscultation.   No wheezes, crackles, or rhonchi. No acute distress. ?Heart:  Regular rate and rhythm; no murmurs, clicks, rubs,  or gallops. ?Abdomen: Non-distended, normal bowel sounds.  Soft and nontender without appreciable mass or hepatosplenomegaly.  ?Pulses:  Normal pulses noted. ?Extremities:  Without clubbing or edema. ? ?Impression: 61 year old lady due for average risk screening colonoscopy.  Lab longstanding GERD; dependent on PPI.  Recently stopped PPI with exacerbation of symptoms.  No alarm features.  Patient desires updated EGD. ? ?Recommendations: I have offered the patient both an EGD and a colonoscopy today. ? ?The risks, benefits, limitations, imponderables and alternatives regarding both EGD and colonoscopy have been reviewed with the patient. Questions have been answered. All parties agreeable.   ? ?Notice: This dictation was prepared with Dragon dictation along with smaller phrase technology. Any transcriptional errors that result from this process are unintentional and may not be corrected upon review.  ? ?

## 2021-05-02 NOTE — Op Note (Signed)
[facility] ?Patient Name: Amanda Higgins ?Procedure Date: 05/02/2021 9:44 AM ?MRN: [id number] ?Date of Birth: 25-Jul-1960 ?Attending MD: Norvel Richards , MD ?CSN: [id number] ?Age: 61 ?Admit Type: Outpatient ?Procedure:                Upper GI endoscopy ?Indications:              Heartburn ?Providers:                Norvel Richards, MD, Charlsie Quest Theda Sers RN, Therapist, sports,  ?                          Randa Spike, Technician ?Referring MD:              ?Medicines:                Propofol per Anesthesia ?Complications:            No immediate complications. ?Estimated Blood Loss:     Estimated blood loss: none. ?Procedure:                Pre-Anesthesia Assessment: ?                          - Prior to the procedure, a History and Physical  ?                          was performed, and patient medications and  ?                          allergies were reviewed. The patient's tolerance of  ?                          previous anesthesia was also reviewed. The risks  ?                          and benefits of the procedure and the sedation  ?                          options and risks were discussed with the patient.  ?                          All questions were answered, and informed consent  ?                          was obtained. Prior Anticoagulants: The patient has  ?                          taken no previous anticoagulant or antiplatelet  ?                          agents. ASA Grade Assessment: II - A patient with  ?                          mild systemic disease. After reviewing the risks  ?  and benefits, the patient was deemed in  ?                          satisfactory condition to undergo the procedure. ?                          After obtaining informed consent, the endoscope was  ?                          passed under direct vision. Throughout the  ?                          procedure, the patient's blood pressure, pulse, and  ?                          oxygen saturations  were monitored continuously. The  ?                          GIF-H190 ([id number]) scope was introduced through the  ?                          mouth, and advanced to the second part of duodenum.  ?                          The upper GI endoscopy was accomplished without  ?                          difficulty. The patient tolerated the procedure  ?                          well. ?Scope In: 10:02:42 AM ?Scope Out: 10:07:59 AM ?Total Procedure Duration: 0 hours 5 minutes 17 seconds  ?Findings: ?     Mucosal straddling GE junction was friable with a couple of erosions  ?     within 5 mm of the GE junction. Esophagus patent throughout its course.  ?     EG junction somewhat patulous. No Barrett's epithelium seen.. ?     EG junction 33 cm; diaphragmatic hiatus 36 cm from incisors.  ?     Normal-appearing gastric mucosa. Patent pylorus. ?     The duodenal bulb and second portion of the duodenum were normal. ?Impression:               -Mild erosive reflux esophagitis ?                          -Normal gastric mucosa ?                          - Small hiatal hernia. ?                          - Normal duodenal bulb and second portion of the  ?                          duodenum. ?                          -  No specimens collected. ?Moderate Sedation: ?     Moderate (conscious) sedation was personally administered by an  ?     anesthesia professional. The following parameters were monitored: oxygen  ?     saturation, heart rate, blood pressure, respiratory rate, EKG, adequacy  ?     of pulmonary ventilation, and response to care. ?Recommendation:           - Patient has a contact number available for  ?                          emergencies. The signs and symptoms of potential  ?                          delayed complications were discussed with the  ?                          patient. Return to normal activities tomorrow.  ?                          Written discharge instructions were provided to the  ?                           patient. ?                          - Advance diet as tolerated. ?                          - Continue present medications. ?                          -Resume omeprazole 20 mg daily???should be taken 30  ?                          minutes before breakfast. GERD information  ?                          provided. Strive for an optimal BMI. Office visit 1  ?                          year. See colonoscopy report ?Procedure Code(s):        --- Professional --- ?                          [phone], Esophagogastroduodenoscopy, flexible,  ?                          transoral; diagnostic, including collection of  ?                          specimen(s) by brushing or washing, when performed  ?                          (separate procedure) ?Diagnosis Code(s):        --- Professional --- ?  K44.9, Diaphragmatic hernia without obstruction or  ?                          gangrene ?                          R12, Heartburn ?CPT copyright 2019 American Medical Association. All rights reserved. ?The codes documented in this report are preliminary and upon coder review may  ?be revised to meet current compliance requirements. ?Cristopher Estimable. Pritesh Sobecki, MD ?Norvel Richards, MD ?05/02/2021 10:33:43 AM ?This report has been signed electronically. ?Number of Addenda: 0 ?

## 2021-05-02 NOTE — Anesthesia Preprocedure Evaluation (Signed)
Anesthesia Evaluation  ?Patient identified by MRN, date of birth, ID band ?Patient awake ? ? ? ?Reviewed: ?Allergy & Precautions, H&P , NPO status , Patient's Chart, lab work & pertinent test results, reviewed documented beta blocker date and time  ? ?Airway ?Mallampati: II ? ?TM Distance: >3 FB ?Neck ROM: full ? ? ? Dental ?no notable dental hx. ? ?  ?Pulmonary ?neg pulmonary ROS,  ?  ?Pulmonary exam normal ?breath sounds clear to auscultation ? ? ? ? ? ? Cardiovascular ?Exercise Tolerance: Good ?negative cardio ROS ? ? ?Rhythm:regular Rate:Normal ? ? ?  ?Neuro/Psych ?PSYCHIATRIC DISORDERS Anxiety negative neurological ROS ?   ? GI/Hepatic ?Neg liver ROS, GERD  Medicated,  ?Endo/Other  ?negative endocrine ROS ? Renal/GU ?negative Renal ROS  ?negative genitourinary ?  ?Musculoskeletal ? ? Abdominal ?  ?Peds ? Hematology ?negative hematology ROS ?(+)   ?Anesthesia Other Findings ? ? Reproductive/Obstetrics ?negative OB ROS ? ?  ? ? ? ? ? ? ? ? ? ? ? ? ? ?  ?  ? ? ? ? ? ? ? ? ?Anesthesia Physical ?Anesthesia Plan ? ?ASA: 2 ? ?Anesthesia Plan: General  ? ?Post-op Pain Management:   ? ?Induction:  ? ?PONV Risk Score and Plan: Propofol infusion ? ?Airway Management Planned:  ? ?Additional Equipment:  ? ?Intra-op Plan:  ? ?Post-operative Plan:  ? ?Informed Consent: I have reviewed the patients History and Physical, chart, labs and discussed the procedure including the risks, benefits and alternatives for the proposed anesthesia with the patient or authorized representative who has indicated his/her understanding and acceptance.  ? ? ? ?Dental Advisory Given ? ?Plan Discussed with: CRNA ? ?Anesthesia Plan Comments:   ? ? ? ? ? ? ?Anesthesia Quick Evaluation ? ?

## 2021-05-02 NOTE — Discharge Instructions (Addendum)
?Colonoscopy ?Discharge Instructions ? ?Read the instructions outlined below and refer to this sheet in the next few weeks. These discharge instructions provide you with general information on caring for yourself after you leave the hospital. Your doctor may also give you specific instructions. While your treatment has been planned according to the most current medical practices available, unavoidable complications occasionally occur. If you have any problems or questions after discharge, call Dr. Gala Romney at [phone]. ?ACTIVITY ?You may resume your regular activity, but move at a slower pace for the next 24 hours.  ?Take frequent rest periods for the next 24 hours.  ?Walking will help get rid of the air and reduce the bloated feeling in your belly (abdomen).  ?No driving for 24 hours (because of the medicine (anesthesia) used during the test).   ?Do not sign any important legal documents or operate any machinery for 24 hours (because of the anesthesia used during the test).  ?NUTRITION ?Drink plenty of fluids.  ?You may resume your normal diet as instructed by your doctor.  ?Begin with a light meal and progress to your normal diet. Heavy or fried foods are harder to digest and may make you feel sick to your stomach (nauseated).  ?Avoid alcoholic beverages for 24 hours or as instructed.  ?MEDICATIONS ?You may resume your normal medications unless your doctor tells you otherwise.  ?WHAT YOU CAN EXPECT TODAY ?Some feelings of bloating in the abdomen.  ?Passage of more gas than usual.  ?Spotting of blood in your stool or on the toilet paper.  ?IF YOU HAD POLYPS REMOVED DURING THE COLONOSCOPY: ?No aspirin products for 7 days or as instructed.  ?No alcohol for 7 days or as instructed.  ?Eat a soft diet for the next 24 hours.  ?FINDING OUT THE RESULTS OF YOUR TEST ?Not all test results are available during your visit. If your test results are not back during the visit, make an appointment with your caregiver to find out the  results. Do not assume everything is normal if you have not heard from your caregiver or the medical facility. It is important for you to follow up on all of your test results.  ?SEEK IMMEDIATE MEDICAL ATTENTION IF: ?You have more than a spotting of blood in your stool.  ?Your belly is swollen (abdominal distention).  ?You are nauseated or vomiting.  ?You have a temperature over 101.  ?You have abdominal pain or discomfort that is severe or gets worse throughout the day.   ?EGD ?Discharge instructions ?Please read the instructions outlined below and refer to this sheet in the next few weeks. These discharge instructions provide you with general information on caring for yourself after you leave the hospital. Your doctor may also give you specific instructions. While your treatment has been planned according to the most current medical practices available, unavoidable complications occasionally occur. If you have any problems or questions after discharge, please call your doctor. ?ACTIVITY ?You may resume your regular activity but move at a slower pace for the next 24 hours.  ?Take frequent rest periods for the next 24 hours.  ?Walking will help expel (get rid of) the air and reduce the bloated feeling in your abdomen.  ?No driving for 24 hours (because of the anesthesia (medicine) used during the test).  ?You may shower.  ?Do not sign any important legal documents or operate any machinery for 24 hours (because of the anesthesia used during the test).  ?NUTRITION ?Drink plenty of fluids.  ?You may  resume your normal diet.  ?Begin with a light meal and progress to your normal diet.  ?Avoid alcoholic beverages for 24 hours or as instructed by your caregiver.  ?MEDICATIONS ?You may resume your normal medications unless your caregiver tells you otherwise.  ?WHAT YOU CAN EXPECT TODAY ?You may experience abdominal discomfort such as a feeling of fullness or ?gas? pains.  ?FOLLOW-UP ?Your doctor will discuss the results of  your test with you.  ?SEEK IMMEDIATE MEDICAL ATTENTION IF ANY OF THE FOLLOWING OCCUR: ?Excessive nausea (feeling sick to your stomach) and/or vomiting.  ?Severe abdominal pain and distention (swelling).  ?Trouble swallowing.  ?Temperature over 101? F (37.8? C).  ?Rectal bleeding or vomiting of blood.   ? ? ?1 colon polyp removed today ? ?Information on GERD, diverticulosis and colon polyps provided ? ?You have mild reflux esophagitis.  Your hiatal hernia slightly enlarged. ? ?Resume omeprazole 20 mg daily-ideally, to take it 30 minutes before breakfast ? ?Strive for an optimal BMI ? ?Office visit with [place] in 1 year- The office will call you to schedule this ? ?Further recommendations to follow pending review of pathology report ? ?At patient request, I called Jeneen Rinks at [phone] -reviewed findings and recommendations ?

## 2021-05-02 NOTE — Op Note (Signed)
[facility] ?Patient Name: Amanda Higgins ?Procedure Date: 05/02/2021 10:09 AM ?MRN: [id number] ?Date of Birth: [birth year] ?Attending MD: Norvel Richards , MD ?CSN: [id number] ?Age: 61 ?Admit Type: Outpatient ?Procedure:                Colonoscopy ?Indications:              Screening for colorectal malignant neoplasm ?Providers:                Norvel Richards, MD, Selena Lesser RN, RN,  ?                          Randa Spike, Technician ?Referring MD:              ?Medicines:                Propofol per Anesthesia ?Complications:            No immediate complications. ?Estimated Blood Loss:     Estimated blood loss was minimal. ?Procedure:                Pre-Anesthesia Assessment: ?                          - Prior to the procedure, a History and Physical  ?                          was performed, and patient medications and  ?                          allergies were reviewed. The patient's tolerance of  ?                          previous anesthesia was also reviewed. The risks  ?                          and benefits of the procedure and the sedation  ?                          options and risks were discussed with the patient.  ?                          All questions were answered, and informed consent  ?                          was obtained. Prior Anticoagulants: The patient has  ?                          taken no previous anticoagulant or antiplatelet  ?                          agents. ASA Grade Assessment: II - A patient with  ?                          mild systemic disease. After reviewing the risks  ?  and benefits, the patient was deemed in  ?                          satisfactory condition to undergo the procedure. ?                          After obtaining informed consent, the colonoscope  ?                          was passed under direct vision. Throughout the  ?                          procedure, the patient's blood pressure, pulse, and  ?                           oxygen saturations were monitored continuously. The  ?                          [id number]) scope was introduced through the  ?                          anus and advanced to the the cecum, identified by  ?                          appendiceal orifice and ileocecal valve. The  ?                          colonoscopy was performed without difficulty. The  ?                          patient tolerated the procedure well. The quality  ?                          of the bowel preparation was adequate. ?Scope In: 10:14:05 AM ?Scope Out: 10:30:08 AM ?Scope Withdrawal Time: 0 hours 11 minutes 41 seconds  ?Total Procedure Duration: 0 hours 16 minutes 3 seconds  ?Findings: ?     The perianal and digital rectal examinations were normal. ?     Multiple small and large-mouthed diverticula were found in the sigmoid  ?     colon and descending colon. (1) 5 mm sessile polyp in the mid ascending  ?     segment. It was removed cleanly with cold snare technique. ?     The exam was otherwise without abnormality on direct and retroflexion  ?     views. ?Impression:               - Diverticulosis in the sigmoid colon and in the  ?                          descending colon. ?                          - No specimens collected. ?Moderate Sedation: ?     Moderate (conscious) sedation was personally administered by an  ?     anesthesia professional. The following parameters were monitored:  oxygen  ?     saturation, heart rate, blood pressure, respiratory rate, EKG, adequacy  ?     of pulmonary ventilation, and response to care. ?Recommendation:           - Patient has a contact number available for  ?                          emergencies. The signs and symptoms of potential  ?                          delayed complications were discussed with the  ?                          patient. Return to normal activities tomorrow.  ?                          Written discharge instructions were provided to the  ?                           patient. ?                          - Advance diet as tolerated. ?                          - Continue present medications. ?                          - Repeat colonoscopy date to be determined after  ?                          pending pathology results are reviewed for  ?                          surveillance based on pathology results. ?                          - Return to GI office in 1 year. ?Procedure Code(s):        --- Professional --- ?                          [phone], Colonoscopy, flexible; diagnostic, including  ?                          collection of specimen(s) by brushing or washing,  ?                          when performed (separate procedure) ?Diagnosis Code(s):        --- Professional --- ?                          Z12.11, Encounter for screening for malignant  ?                          neoplasm of colon ?  K57.30, Diverticulosis of large intestine without  ?                          perforation or abscess without bleeding ?CPT copyright 2019 American Medical Association. All rights reserved. ?The codes documented in this report are preliminary and upon coder review may  ?be revised to meet current compliance requirements. ?Amanda Higgins. [name], MD ?Norvel Richards, MD ?05/02/2021 10:36:57 AM ?This report has been signed electronically. ?Number of Addenda: 0 ?

## 2021-05-02 NOTE — Transfer of Care (Signed)
Immediate Anesthesia Transfer of Care Note ? ?Patient: Amanda Higgins ? ?Procedure(s) Performed: COLONOSCOPY WITH PROPOFOL ?ESOPHAGOGASTRODUODENOSCOPY (EGD) WITH PROPOFOL ?POLYPECTOMY ? ?Patient Location: PACU ? ?Anesthesia Type:General ? ?Level of Consciousness: awake, alert  and oriented ? ?Airway & Oxygen Therapy: Patient Spontanous Breathing ? ?Post-op Assessment: Report given to RN, Post -op Vital signs reviewed and stable, Patient moving all extremities X 4 and Patient able to stick tongue midline ? ?Post vital signs: Reviewed ? ?Last Vitals:  ?Vitals Value Taken Time  ?BP 99/63   ?Temp 36.0   ?Pulse 89   ?Resp 22   ?SpO2 99   ? ? ?Last Pain:  ?Vitals:  ? 05/02/21 1002  ?TempSrc:   ?PainSc: 0-No pain  ?   ? ?Patients Stated Pain Goal: 7 (05/02/21 0855) ? ?Complications: No notable events documented. ?

## 2021-05-03 ENCOUNTER — Encounter: Payer: Self-pay | Admitting: Internal Medicine

## 2021-05-03 LAB — SURGICAL PATHOLOGY

## 2021-05-03 NOTE — Anesthesia Postprocedure Evaluation (Signed)
Anesthesia Post Note ? ?Patient: Amanda Higgins ? ?Procedure(s) Performed: COLONOSCOPY WITH PROPOFOL ?ESOPHAGOGASTRODUODENOSCOPY (EGD) WITH PROPOFOL ?POLYPECTOMY ? ?Patient location during evaluation: Phase II ?Anesthesia Type: General ?Level of consciousness: awake ?Pain management: pain level controlled ?Vital Signs Assessment: post-procedure vital signs reviewed and stable ?Respiratory status: spontaneous breathing and respiratory function stable ?Cardiovascular status: blood pressure returned to baseline and stable ?Postop Assessment: no headache and no apparent nausea or vomiting ?Anesthetic complications: no ?Comments: Late entry ? ? ?No notable events documented. ? ? ?Last Vitals:  ?Vitals:  ? 05/02/21 0901 05/02/21 1035  ?BP: (!) 145/79 94/63  ?Pulse: 99 89  ?Resp: 13 18  ?Temp:  36.6 ?C  ?SpO2:  95%  ?  ?Last Pain:  ?Vitals:  ? 05/02/21 1035  ?TempSrc: Oral  ?PainSc: 0-No pain  ? ? ?  ?  ?  ?  ?  ?  ? ?Louann Sjogren ? ? ? ? ?

## 2021-05-04 ENCOUNTER — Encounter (HOSPITAL_COMMUNITY): Payer: Self-pay | Admitting: Internal Medicine

## 2021-05-06 ENCOUNTER — Telehealth: Payer: 59 | Admitting: Family Medicine

## 2021-05-06 DIAGNOSIS — N3000 Acute cystitis without hematuria: Secondary | ICD-10-CM

## 2021-05-06 MED ORDER — SULFAMETHOXAZOLE-TRIMETHOPRIM 800-160 MG PO TABS
1.0000 | ORAL_TABLET | Freq: Two times a day (BID) | ORAL | 0 refills | Status: AC
Start: 2021-05-06 — End: 2021-05-11

## 2021-05-06 NOTE — Progress Notes (Signed)
E-Visit for Urinary Problems  We are sorry that you are not feeling well.  Here is how we plan to help!  Based on what you shared with me it looks like you most likely have a simple urinary tract infection.  A UTI (Urinary Tract Infection) is a bacterial infection of the bladder.  Most cases of urinary tract infections are simple to treat but a key part of your care is to encourage you to drink plenty of fluids and watch your symptoms carefully.  I have prescribed Bactrim DS One tablet twice a day for 5 days.  Your symptoms should gradually improve. Call us if the burning in your urine worsens, you develop worsening fever, back pain or pelvic pain or if your symptoms do not resolve after completing the antibiotic.  Urinary tract infections can be prevented by drinking plenty of water to keep your body hydrated.  Also be sure when you wipe, wipe from front to back and don't hold it in!  If possible, empty your bladder every 4 hours.  HOME CARE Drink plenty of fluids Compete the full course of the antibiotics even if the symptoms resolve Remember, when you need to go.go. Holding in your urine can increase the likelihood of getting a UTI! GET HELP RIGHT AWAY IF: You cannot urinate You get a high fever Worsening back pain occurs You see blood in your urine You feel sick to your stomach or throw up You feel like you are going to pass out  MAKE SURE YOU  Understand these instructions. Will watch your condition. Will get help right away if you are not doing well or get worse.   Thank you for choosing an e-visit.  Your e-visit answers were reviewed by a board certified advanced clinical practitioner to complete your personal care plan. Depending upon the condition, your plan could have included both over the counter or prescription medications.  Please review your pharmacy choice. Make sure the pharmacy is open so you can pick up prescription now. If there is a problem, you may contact  your provider through MyChart messaging and have the prescription routed to another pharmacy.  Your safety is important to us. If you have drug allergies check your prescription carefully.   For the next 24 hours you can use MyChart to ask questions about today's visit, request a non-urgent call back, or ask for a work or school excuse. You will get an email in the next two days asking about your experience. I hope that your e-visit has been valuable and will speed your recovery.   I have provided 5 minutes of non face to face time during this encounter for chart review and documentation.   

## 2021-05-11 ENCOUNTER — Other Ambulatory Visit (HOSPITAL_COMMUNITY): Payer: Self-pay

## 2021-05-12 ENCOUNTER — Telehealth: Payer: 59 | Admitting: Physician Assistant

## 2021-05-12 DIAGNOSIS — N390 Urinary tract infection, site not specified: Secondary | ICD-10-CM

## 2021-05-12 MED ORDER — CEPHALEXIN 500 MG PO CAPS: 500.0000 mg | ORAL_CAPSULE | Freq: Two times a day (BID) | ORAL | 0 refills | Status: AC

## 2021-05-12 NOTE — Progress Notes (Signed)
E-Visit for Urinary Problems ? ?We are sorry that you are not feeling well.  Here is how we plan to help! ? ?Based on what you shared with me it looks like you most likely have a simple urinary tract infection. ? ?A UTI (Urinary Tract Infection) is a bacterial infection of the bladder. ? ?Most cases of urinary tract infections are simple to treat but a key part of your care is to encourage you to drink plenty of fluids and watch your symptoms carefully. ? ?I have prescribed Keflex 500 mg twice a day for 7 days giving your history of requiring this in the past with UTI.  I do request that you go ahead and schedule a follow-up with your primary care provider to get a urinalysis and culture just because of being on Augmentin in the past month for a sinusitis to make sure this UTI is not resistant to multiple medications. It is ok to go ahead and start the Keflex in the meantime. If anything is worsening before follow-up with PCP, I want you to be evaluated at a local Urgent Care.  ? ?Urinary tract infections can be prevented by drinking plenty of water to keep your body hydrated.  Also be sure when you wipe, wipe from front to back and don't hold it in!  If possible, empty your bladder every 4 hours. ? ?HOME CARE ?Drink plenty of fluids ?Compete the full course of the antibiotics even if the symptoms resolve ?Remember, when you need to go?go. Holding in your urine can increase the likelihood of getting a UTI! ?GET HELP RIGHT AWAY IF: ?You cannot urinate ?You get a high fever ?Worsening back pain occurs ?You see blood in your urine ?You feel sick to your stomach or throw up ?You feel like you are going to pass out ? ?MAKE SURE YOU  ?Understand these instructions. ?Will watch your condition. ?Will get help right away if you are not doing well or get worse. ? ? ?Thank you for choosing an e-visit. ? ?Your e-visit answers were reviewed by a board certified advanced clinical practitioner to complete your personal care plan.  Depending upon the condition, your plan could have included both over the counter or prescription medications. ? ?Please review your pharmacy choice. Make sure the pharmacy is open so you can pick up prescription now. If there is a problem, you may contact your provider through [employer] and have the prescription routed to another pharmacy.  Your safety is important to [place]. If you have drug allergies check your prescription carefully.  ? ?For the next 24 hours you can use MyChart to ask questions about today's visit, request a non-urgent call back, or ask for a work or school excuse. ?You will get an email in the next two days asking about your experience. I hope that your e-visit has been valuable and will speed your recovery. ? ?

## 2021-05-23 ENCOUNTER — Other Ambulatory Visit (HOSPITAL_COMMUNITY): Payer: Self-pay

## 2021-05-24 ENCOUNTER — Other Ambulatory Visit (HOSPITAL_COMMUNITY): Payer: Self-pay

## 2021-05-26 ENCOUNTER — Other Ambulatory Visit (HOSPITAL_BASED_OUTPATIENT_CLINIC_OR_DEPARTMENT_OTHER): Payer: Self-pay

## 2021-06-07 ENCOUNTER — Other Ambulatory Visit (HOSPITAL_COMMUNITY): Payer: Self-pay | Admitting: Internal Medicine

## 2021-06-07 DIAGNOSIS — Z1231 Encounter for screening mammogram for malignant neoplasm of breast: Secondary | ICD-10-CM

## 2021-06-08 ENCOUNTER — Other Ambulatory Visit (HOSPITAL_COMMUNITY): Payer: Self-pay

## 2021-06-16 ENCOUNTER — Ambulatory Visit (HOSPITAL_COMMUNITY)
Admission: RE | Admit: 2021-06-16 | Discharge: 2021-06-16 | Disposition: A | Discharge status: Home / Self Care | Payer: 59 | Source: Ambulatory Visit | Attending: Internal Medicine | Admitting: Internal Medicine

## 2021-06-16 ENCOUNTER — Other Ambulatory Visit (HOSPITAL_COMMUNITY): Payer: Self-pay | Admitting: Internal Medicine

## 2021-06-16 DIAGNOSIS — Z1231 Encounter for screening mammogram for malignant neoplasm of breast: Secondary | ICD-10-CM | POA: Insufficient documentation

## 2021-06-16 DIAGNOSIS — R928 Other abnormal and inconclusive findings on diagnostic imaging of breast: Secondary | ICD-10-CM

## 2021-06-20 ENCOUNTER — Encounter: Payer: Self-pay | Admitting: Internal Medicine

## 2021-06-20 ENCOUNTER — Other Ambulatory Visit (HOSPITAL_COMMUNITY): Payer: Self-pay

## 2021-06-21 ENCOUNTER — Ambulatory Visit (INDEPENDENT_AMBULATORY_CARE_PROVIDER_SITE_OTHER): Payer: 59 | Admitting: Internal Medicine

## 2021-06-21 ENCOUNTER — Encounter: Payer: Self-pay | Admitting: Internal Medicine

## 2021-06-21 DIAGNOSIS — K2100 Gastro-esophageal reflux disease with esophagitis, without bleeding: Secondary | ICD-10-CM

## 2021-06-21 DIAGNOSIS — R7303 Prediabetes: Secondary | ICD-10-CM | POA: Diagnosis not present

## 2021-06-21 DIAGNOSIS — Z0000 Encounter for general adult medical examination without abnormal findings: Secondary | ICD-10-CM | POA: Diagnosis not present

## 2021-06-21 DIAGNOSIS — E782 Mixed hyperlipidemia: Secondary | ICD-10-CM | POA: Diagnosis not present

## 2021-06-21 NOTE — Assessment & Plan Note (Signed)
Well controlled with Prilosec 

## 2021-06-21 NOTE — Progress Notes (Signed)
? ?Established Patient Office Visit ? ?Subjective:  ?Patient ID: Amanda Higgins, female    DOB: [birth year]  Age: 61 y.o. MRN: [id number] ? ?CC:  ?Chief Complaint  ?Patient presents with  ? Follow-up  ?  3 month follow up weight management wegovy is working   ? ? ?HPI ?Amanda Higgins is a 61 y.o. female with past medical history of GAD, GERD and obesity who presents for f/u of her chronic medical conditions. ? ?She has started using [facility] for weight loss.  She has been tolerating it well.  She has mild nausea, but is manageable currently. She has lost about 18 pounds since 11/22, but she was on Rybelsus at that time. ? ?She had EGD, which showed erosive esophagitis. She needs to take Omeprazole QD, sometimes BID for it. Denies dysphagia or odynophagia. ? ? ?Past Medical History:  ?Diagnosis Date  ? Anxiety   ? Phreesia 07/08/2019  ? Elevated cholesterol with elevated triglycerides 10/01/2015  ? Encounter for gynecological examination with Papanicolaou smear of cervix 04/17/2018  ? Encounter for well woman exam with routine gynecological exam 02/02/2017  ? Hematuria 12/11/2012  ? Hormone replacement therapy (HRT) 02/02/2017  ? Hx: UTI (urinary tract infection)   ? Left shoulder pain   ? Nerve impingement  ? Obesity   ? Pityriasis rosea 04/17/2018  ? PMB (postmenopausal bleeding) 09/24/2015  ? Usually at time of changing patches   ? Postmenopausal HRT (hormone replacement therapy) 12/17/2012  ? On combi patch  ? Screening for colorectal cancer 04/17/2018  ? Urinary frequency 07/11/2013  ? Urinary pain 12/11/2012  ? UTI (urinary tract infection) 11/29/2016  ? +Ecoli  ? ? ?Past Surgical History:  ?Procedure Laterality Date  ? CHOLECYSTECTOMY N/A   ? Phreesia 07/08/2019  ? COLONOSCOPY WITH ESOPHAGOGASTRODUODENOSCOPY (EGD) N/A 10/18/2012  ? Procedure: COLONOSCOPY WITH ESOPHAGOGASTRODUODENOSCOPY (EGD);  Surgeon: Daneil Dolin, MD;  [facility];  Service: Endoscopy;  Laterality: N/A;  9:45 AM  ? COLONOSCOPY WITH  PROPOFOL N/A 05/02/2021  ? Procedure: COLONOSCOPY WITH PROPOFOL;  Surgeon: Daneil Dolin, MD;  Location: [facility];  Service: Endoscopy;  Laterality: N/A;  9:45 / ASA 2  ? ESOPHAGOGASTRODUODENOSCOPY (EGD) WITH PROPOFOL N/A 05/02/2021  ? Procedure: ESOPHAGOGASTRODUODENOSCOPY (EGD) WITH PROPOFOL;  Surgeon: Daneil Dolin, MD;  [facility]: [facility];  Service: Endoscopy;  Laterality: N/A;  9:45 / ASA 2  ? LAPAROSCOPIC CHOLECYSTECTOMY    ? POLYPECTOMY  05/02/2021  ? Procedure: POLYPECTOMY;  Surgeon: Daneil Dolin, MD;  Location: [facility];  Service: Endoscopy;;  ascending colon  ? ? ?Family History  ?Problem Relation Age of Onset  ? Hyperlipidemia Mother   ? Hypertension Mother   ? GER disease Mother   ? Heart disease Father   ?     atrial fib  ? Hypertension Father   ? Arthritis Father   ?     RA  ? Heart disease Paternal Grandmother   ?     CHF  ? Hyperlipidemia Maternal Grandmother   ? Kidney disease Brother   ?     kidney stones  ? Other Sister   ?     precancerous breast lesion  ? Kidney disease Brother   ?     kidney stones  ? Colon cancer Neg Hx   ? ? ?Social History  ? ?Socioeconomic History  ? Marital status: Married  ?  Spouse name: Jeneen Rinks   ? Number of children: 2  ? Years  of education: 16  ? Highest education level: Bachelor's degree (e.g., BA, AB, BS)  ?Occupational History  ? Not on file  ?Tobacco Use  ? Smoking status: Never  ? Smokeless tobacco: Never  ?Vaping Use  ? Vaping Use: Never used  ?Substance and Sexual Activity  ? Alcohol use: Yes  ?  Comment: occasional wine  ? Drug use: No  ? Sexual activity: Yes  ?  Birth control/protection: Post-menopausal  ?Other Topics Concern  ? Not on file  ?Social History Narrative  ? Lives with husband, Jeneen Rinks, married 9 years.  ?   ? Blended family: each had two.  ? Two daughters  ? He had a daughter and son  ?   ? Enjoys traveling: within [place], [place], [place]   ?   ? Sunscreen and seat belt wearer  ? Well-balanced diet  ? Drinks water- does enjoy  diet coke  ?   ? Does take vitamin D and C  ?   ?   ? ?Social Determinants of Health  ? ?Financial Resource Strain: Not on file  ?Food Insecurity: Not on file  ?Transportation Needs: Not on file  ?Physical Activity: Not on file  ?Stress: Not on file  ?Social Connections: Not on file  ?Intimate Partner Violence: Not on file  ? ? ?Outpatient Medications Prior to Visit  ?Medication Sig Dispense Refill  ? acetaminophen (TYLENOL) 500 MG tablet Take 1,000 mg by mouth every 8 (eight) hours as needed for moderate pain.    ? Ascorbic Acid (VITAMIN C) 1000 MG tablet Take 1,000 mg by mouth daily.     ? Biotin 10000 MCG TABS Take 10,000 mcg by mouth at bedtime.    ? CALCIUM PO Take 1 tablet by mouth daily.    ? cholecalciferol (VITAMIN D) 1000 units tablet Take 1,000 Units by mouth 2 (two) times daily.    ? estradiol-levonorgestrel (CLIMARA PRO) 0.045-0.015 MG/DAY PLACE 1 PATCH ONTO THE SKIN ONCE A WEEK. 4 patch 2  ? ibuprofen (ADVIL,MOTRIN) 200 MG tablet Take 600 mg by mouth every 8 (eight) hours as needed for headache or moderate pain.    ? omeprazole (PRILOSEC) 20 MG capsule Take 1 capsule (20 mg total) by mouth daily. 90 capsule 3  ? Prenatal Vit-Fe Fumarate-FA (PRENATAL MULTIVITAMIN) TABS tablet Take 1 tablet by mouth at bedtime.    ? progesterone (PROMETRIUM) 200 MG capsule Take 1 capsule (200 mg total) by mouth daily. (Patient taking differently: Take 200 mg by mouth at bedtime.) 90 capsule 2  ? Semaglutide-Weight Management 2.4 MG/0.75ML SOAJ Inject 2.4 mg into the skin once a week. 3 mL 3  ? zinc gluconate 50 MG tablet Take 50 mg by mouth daily.    ? azelastine (ASTELIN) 0.1 % nasal spray Place 2 sprays into both nostrils 2 (two) times daily. Use in each nostril as directed 30 mL 0  ? benzonatate (TESSALON) 100 MG capsule Take 1 capsule (100 mg total) by mouth 2 (two) times daily as needed for cough. 20 capsule 0  ? Semaglutide-Weight Management (WEGOVY) 1 MG/0.5ML SOAJ Inject 1 mg into the skin once a week. 2 mL 0   ? ?No facility-administered medications prior to visit.  ? ? ?Allergies  ?Allergen Reactions  ? Macrobid [Nitrofurantoin [employer], Shortness Of Breath and Itching  ? ? ?ROS ?Review of Systems  ?Constitutional:  Negative for chills and fever.  ?HENT:  Negative for congestion, sinus pressure, sinus pain and sore throat.   ?Eyes:  Negative for pain and discharge.  ?Respiratory:  Negative for cough and shortness of breath.   ?Cardiovascular:  Negative for chest pain and palpitations.  ?Gastrointestinal:  Positive for nausea. Negative for abdominal pain, constipation, diarrhea and vomiting.  ?Endocrine: Negative for polydipsia and polyuria.  ?Genitourinary:  Negative for dysuria and hematuria.  ?Musculoskeletal:  Negative for neck pain and neck stiffness.  ?Skin:  Negative for rash.  ?Neurological:  Negative for dizziness and weakness.  ?Psychiatric/Behavioral:  Negative for agitation and behavioral problems.   ? ?  ?Objective:  ?  ?Physical Exam ?Vitals reviewed.  ?Constitutional:   ?   General: She is not in acute distress. ?   Appearance: She is obese. She is not diaphoretic.  ?HENT:  ?   Head: Normocephalic and atraumatic.  ?   Nose: Nose normal.  ?   Mouth/Throat:  ?   Mouth: Mucous membranes are moist.  ?Eyes:  ?   General: No scleral icterus. ?   Extraocular Movements: Extraocular movements intact.  ?Neck:  ?   Thyroid: Thyromegaly present. No thyroid tenderness.  ?Cardiovascular:  ?   Rate and Rhythm: Normal rate and regular rhythm.  ?   Pulses: Normal pulses.  ?   Heart sounds: Normal heart sounds. No murmur heard. ?Pulmonary:  ?   Breath sounds: Normal breath sounds. No wheezing or rales.  ?Musculoskeletal:  ?   Cervical back: Neck supple. No tenderness.  ?   Right lower leg: No edema.  ?   Left lower leg: No edema.  ?Skin: ?   General: Skin is warm.  ?   Findings: No rash.  ?Neurological:  ?   General: No focal deficit present.  ?   Mental Status: She is alert and oriented to person, place, and  time.  ?Psychiatric:     ?   Mood and Affect: Mood normal.     ?   Behavior: Behavior normal.  ? ? ?BP 122/82 (BP Location: Left Arm, Patient Position: Sitting, Cuff Size: Normal)   Pulse 79   Resp 18   Ht 5' 5.5"

## 2021-06-21 NOTE — Patient Instructions (Signed)
Please continue taking medications as prescribed.  Please continue to follow low carb diet and perform moderate exercise/walking at least 150 mins/week. 

## 2021-06-21 NOTE — Assessment & Plan Note (Signed)
BMI Readings from Last 3 Encounters:  ?06/21/21 35.20 kg/m?  ?05/02/21 35.78 kg/m?  ?04/13/21 36.61 kg/m?  ? ?Initial BMI - 37.54 when Wegovy started ?Diet modification and moderate exercise advised ?On Wegovy 2.4 mg qw now ?

## 2021-06-21 NOTE — Assessment & Plan Note (Signed)
Diet modification for now ?Check lipid profile ?

## 2021-06-21 NOTE — Assessment & Plan Note (Signed)
On Wegovy for weight loss benefit ?

## 2021-06-22 ENCOUNTER — Other Ambulatory Visit (HOSPITAL_COMMUNITY): Payer: Self-pay

## 2021-06-23 ENCOUNTER — Encounter (HOSPITAL_COMMUNITY): Payer: Self-pay

## 2021-06-23 ENCOUNTER — Ambulatory Visit (HOSPITAL_COMMUNITY)
Admission: RE | Admit: 2021-06-23 | Discharge: 2021-06-23 | Disposition: A | Discharge status: Home / Self Care | Payer: 59 | Source: Ambulatory Visit | Attending: Internal Medicine | Admitting: Internal Medicine

## 2021-06-23 DIAGNOSIS — R928 Other abnormal and inconclusive findings on diagnostic imaging of breast: Secondary | ICD-10-CM | POA: Diagnosis not present

## 2021-06-28 DIAGNOSIS — S83241D Other tear of medial meniscus, current injury, right knee, subsequent encounter: Secondary | ICD-10-CM | POA: Diagnosis not present

## 2021-07-18 ENCOUNTER — Other Ambulatory Visit (HOSPITAL_COMMUNITY): Payer: Self-pay

## 2021-08-15 ENCOUNTER — Other Ambulatory Visit (HOSPITAL_COMMUNITY): Payer: Self-pay

## 2021-08-31 ENCOUNTER — Ambulatory Visit: Payer: 59 | Admitting: Nurse Practitioner

## 2021-09-05 ENCOUNTER — Other Ambulatory Visit: Payer: Self-pay

## 2021-09-06 ENCOUNTER — Other Ambulatory Visit (HOSPITAL_COMMUNITY): Payer: Self-pay

## 2021-09-08 ENCOUNTER — Other Ambulatory Visit (HOSPITAL_COMMUNITY): Payer: Self-pay

## 2021-09-08 ENCOUNTER — Other Ambulatory Visit: Payer: Self-pay | Admitting: Nurse Practitioner

## 2021-09-09 ENCOUNTER — Other Ambulatory Visit (HOSPITAL_COMMUNITY): Payer: Self-pay

## 2021-09-09 MED ORDER — OMEPRAZOLE 20 MG PO CPDR
20.0000 mg | DELAYED_RELEASE_CAPSULE | Freq: Every day | ORAL | 3 refills | Status: DC
  Filled 2021-09-09: qty 90, 90d supply, fill #0
  Filled 2021-12-13: qty 90, 90d supply, fill #1
  Filled 2022-03-20: qty 90, 90d supply, fill #2
  Filled 2022-06-12: qty 90, 90d supply, fill #3

## 2021-09-19 ENCOUNTER — Other Ambulatory Visit (HOSPITAL_COMMUNITY): Payer: Self-pay

## 2021-09-19 ENCOUNTER — Other Ambulatory Visit: Payer: Self-pay | Admitting: Adult Health

## 2021-09-19 MED ORDER — PROGESTERONE 200 MG PO CAPS
200.0000 mg | ORAL_CAPSULE | Freq: Every day | ORAL | 2 refills | Status: DC
  Filled 2021-09-19: qty 90, 90d supply, fill #0
  Filled 2021-12-13: qty 90, 90d supply, fill #1
  Filled 2022-03-20: qty 90, 90d supply, fill #2

## 2021-09-20 ENCOUNTER — Other Ambulatory Visit (HOSPITAL_COMMUNITY): Payer: Self-pay

## 2021-09-21 ENCOUNTER — Other Ambulatory Visit (HOSPITAL_COMMUNITY): Payer: Self-pay

## 2021-09-22 ENCOUNTER — Telehealth: Payer: 59 | Admitting: Family Medicine

## 2021-09-22 DIAGNOSIS — R3989 Other symptoms and signs involving the genitourinary system: Secondary | ICD-10-CM | POA: Diagnosis not present

## 2021-09-22 MED ORDER — CEPHALEXIN 500 MG PO CAPS: 500.0000 mg | ORAL_CAPSULE | Freq: Two times a day (BID) | ORAL | 0 refills | Status: AC

## 2021-09-22 NOTE — Progress Notes (Signed)

## 2021-09-26 ENCOUNTER — Other Ambulatory Visit: Payer: Self-pay | Admitting: Internal Medicine

## 2021-09-26 ENCOUNTER — Other Ambulatory Visit (HOSPITAL_COMMUNITY): Payer: Self-pay

## 2021-09-26 DIAGNOSIS — E669 Obesity, unspecified: Secondary | ICD-10-CM

## 2021-09-26 MED ORDER — PRENATAL MULTIVITAMIN CH
1.0000 | ORAL_TABLET | Freq: Every day | ORAL | 2 refills | Status: DC
  Filled 2021-09-26: qty 30, 30d supply, fill #0
  Filled 2021-11-17: qty 30, 30d supply, fill #1
  Filled 2021-12-13: qty 30, 30d supply, fill #2

## 2021-09-26 MED ORDER — WEGOVY 2.4 MG/0.75ML ~~LOC~~ SOAJ
2.4000 mg | SUBCUTANEOUS | 3 refills | Status: DC
  Filled 2021-09-26: qty 3, 28d supply, fill #0
  Filled 2021-10-21: qty 3, 28d supply, fill #1
  Filled 2021-11-17: qty 3, 28d supply, fill #2
  Filled 2021-12-13: qty 3, 28d supply, fill #3

## 2021-10-21 ENCOUNTER — Other Ambulatory Visit (HOSPITAL_COMMUNITY): Payer: Self-pay

## 2021-11-08 ENCOUNTER — Ambulatory Visit (INDEPENDENT_AMBULATORY_CARE_PROVIDER_SITE_OTHER): Payer: 59 | Admitting: Internal Medicine

## 2021-11-08 ENCOUNTER — Encounter: Payer: Self-pay | Admitting: Internal Medicine

## 2021-11-08 VITALS — BP 124/84 | HR 76 | Resp 18 | Ht 65.5 in | Wt 206.0 lb

## 2021-11-08 DIAGNOSIS — Z0001 Encounter for general adult medical examination with abnormal findings: Secondary | ICD-10-CM | POA: Diagnosis not present

## 2021-11-08 DIAGNOSIS — K2100 Gastro-esophageal reflux disease with esophagitis, without bleeding: Secondary | ICD-10-CM

## 2021-11-08 DIAGNOSIS — R7303 Prediabetes: Secondary | ICD-10-CM | POA: Diagnosis not present

## 2021-11-08 DIAGNOSIS — Z23 Encounter for immunization: Secondary | ICD-10-CM

## 2021-11-08 DIAGNOSIS — E782 Mixed hyperlipidemia: Secondary | ICD-10-CM | POA: Diagnosis not present

## 2021-11-08 DIAGNOSIS — Z0000 Encounter for general adult medical examination without abnormal findings: Secondary | ICD-10-CM | POA: Diagnosis not present

## 2021-11-08 NOTE — Assessment & Plan Note (Addendum)
BMI Readings from Last 3 Encounters:  11/08/21 33.76 kg/m  06/21/21 35.20 kg/m  05/02/21 35.78 kg/m   Initial BMI - 37.54 when Wegovy started Diet modification and moderate exercise advised On Wegovy, tolerates it well

## 2021-11-08 NOTE — Assessment & Plan Note (Signed)
Physical exam as documented. Fasting blood tests today. Flu vaccine today. Wants to think about Shingrix vaccine.

## 2021-11-08 NOTE — Assessment & Plan Note (Addendum)
Well controlled with Prilosec

## 2021-11-08 NOTE — Progress Notes (Signed)
Established Patient Office Visit  Subjective:  Patient ID: Amanda Higgins, female    DOB: 05-10-1960  Age: 61 y.o. MRN: [id number]  CC:  Chief Complaint  Patient presents with   Annual Exam    Annual exam     HPI Amanda Higgins is a 61 y.o. female with past medical history of GAD, GERD and obesity who presents for annual physical.  She has been using Wegovy for weight loss.  She has been tolerating it well.  She has mild nausea, but is manageable currently. She has lost about 26 pounds since 11/22, but she was on Rybelsus at that time.  She had EGD, which showed erosive esophagitis. She needs to take Omeprazole QD, sometimes BID for it. Denies dysphagia or odynophagia.    Past Medical History:  Diagnosis Date   Anxiety    Phreesia 07/08/2019   Elevated cholesterol with elevated triglycerides 10/01/2015   Encounter for gynecological examination with Papanicolaou smear of cervix 04/17/2018   Encounter for well woman exam with routine gynecological exam 02/02/2017   Hematuria 12/11/2012   Hormone replacement therapy (HRT) 02/02/2017   Hx: UTI (urinary tract infection)    Left shoulder pain    Nerve impingement   Obesity    Pityriasis rosea 04/17/2018   PMB (postmenopausal bleeding) 09/24/2015   Usually at time of changing patches    Postmenopausal HRT (hormone replacement therapy) 12/17/2012   On combi patch   Screening for colorectal cancer 04/17/2018   Urinary frequency 07/11/2013   Urinary pain 12/11/2012   UTI (urinary tract infection) 11/29/2016   +Ecoli    Past Surgical History:  Procedure Laterality Date   CHOLECYSTECTOMY N/A    Phreesia 07/08/2019   COLONOSCOPY WITH ESOPHAGOGASTRODUODENOSCOPY (EGD) N/A 10/18/2012   Procedure: COLONOSCOPY WITH ESOPHAGOGASTRODUODENOSCOPY (EGD);  Surgeon: Daneil Dolin, MD;  [facility];  Service: Endoscopy;  Laterality: N/A;  9:45 AM   COLONOSCOPY WITH PROPOFOL N/A 05/02/2021   Procedure: COLONOSCOPY WITH PROPOFOL;   Surgeon: Daneil Dolin, MD;  [facility];  Service: Endoscopy;  Laterality: N/A;  9:45 / ASA 2   ESOPHAGOGASTRODUODENOSCOPY (EGD) WITH PROPOFOL N/A 05/02/2021   Procedure: ESOPHAGOGASTRODUODENOSCOPY (EGD) WITH PROPOFOL;  Surgeon: Daneil Dolin, MD;  [facility];  Service: Endoscopy;  Laterality: N/A;  9:45 / ASA 2   LAPAROSCOPIC CHOLECYSTECTOMY     POLYPECTOMY  05/02/2021   Procedure: POLYPECTOMY;  Surgeon: Daneil Dolin, MD;  [facility];  Service: Endoscopy;;  ascending colon    Family History  Problem Relation Age of Onset   Hyperlipidemia Mother    Hypertension Mother    GER disease Mother    Heart disease Father        atrial fib   Hypertension Father    Arthritis Father        RA   Heart disease Paternal Grandmother        CHF   Hyperlipidemia Maternal Grandmother    Kidney disease Brother        kidney stones   Other Sister        precancerous breast lesion   Kidney disease Brother        kidney stones   Colon cancer Neg Hx     Social History   Socioeconomic History   Marital status: Married    Spouse name: Jeneen Rinks    Number of children: 2   Years of education: 16   Highest education level: Bachelor's  degree (e.g., BA, AB, BS)  Occupational History   Not on file  Tobacco Use   Smoking status: Never   Smokeless tobacco: Never  Vaping Use   Vaping Use: Never used  Substance and Sexual Activity   Alcohol use: Yes    Comment: occasional wine   Drug use: No   Sexual activity: Yes    Birth control/protection: Post-menopausal  Other Topics Concern   Not on file  Social History Narrative   Lives with husband, Jeneen Rinks, married 9 years.      Blended family: each had two.   Two daughters   He had a daughter and son      Enjoys traveling: within [place], [place], [place]       Sunscreen and seat belt wearer   Well-balanced diet   Drinks water- does enjoy diet coke      Does take vitamin D and C         Social  Determinants of Health   Financial Resource Strain: Low Risk  (05/28/2019)   Overall Financial Resource Strain (CARDIA)    Difficulty of Paying Living Expenses: Not hard at all  Food Insecurity: No Food Insecurity (05/28/2019)   Hunger Vital Sign    Worried About Running Out of Food in the Last Year: Never true    Ran Out of Food in the Last Year: Never true  Transportation Needs: No Transportation Needs (05/28/2019)   PRAPARE - Hydrologist (Medical): No    Lack of Transportation (Non-Medical): No  Physical Activity: Insufficiently Active (05/28/2019)   Exercise Vital Sign    Days of Exercise per Week: 3 days    Minutes of Exercise per Session: 30 min  Stress: No Stress Concern Present (05/28/2019)   [place]    Feeling of Stress : Only a little  Social Connections: Moderately Integrated (05/28/2019)   Social Connection and Isolation Panel [NHANES]    Frequency of Communication with Friends and Family: More than three times a week    Frequency of Social Gatherings with Friends and Family: More than three times a week    Attends Religious Services: More than 4 times per year    Active Member of [employer] or Organizations: No    Attends Archivist Meetings: Never    Marital Status: Married  Human resources officer Violence: Not At Risk (05/28/2019)   Humiliation, Afraid, Rape, and Kick questionnaire    Fear of Current or Ex-Partner: No    Emotionally Abused: No    Physically Abused: No    Sexually Abused: No    Outpatient Medications Prior to Visit  Medication Sig Dispense Refill   acetaminophen (TYLENOL) 500 MG tablet Take 1,000 mg by mouth every 8 (eight) hours as needed for moderate pain.     Ascorbic Acid (VITAMIN C) 1000 MG tablet Take 1,000 mg by mouth daily.      Biotin 10000 MCG TABS Take 10,000 mcg by mouth at bedtime.     cholecalciferol (VITAMIN D) 1000 units tablet Take 1,000  Units by mouth 2 (two) times daily.     estradiol-levonorgestrel (CLIMARA PRO) 0.045-0.015 MG/DAY PLACE 1 PATCH ONTO THE SKIN ONCE A WEEK. 4 patch 2   ibuprofen (ADVIL,MOTRIN) 200 MG tablet Take 600 mg by mouth every 8 (eight) hours as needed for headache or moderate pain.     omeprazole (PRILOSEC) 20 MG capsule Take 1 capsule (20 mg total) by mouth  daily. 90 capsule 3   Prenatal Vit-Fe Fumarate-FA (PRENATAL MULTIVITAMIN) TABS tablet Take 1 tablet by mouth at bedtime. 30 tablet 2   progesterone (PROMETRIUM) 200 MG capsule Take 1 capsule (200 mg total) by mouth daily. 90 capsule 2   Semaglutide-Weight Management (WEGOVY) 2.4 MG/0.75ML SOAJ Inject 2.4 mg into the skin once a week. 3 mL 3   zinc gluconate 50 MG tablet Take 50 mg by mouth daily.     CALCIUM PO Take 1 tablet by mouth daily.     No facility-administered medications prior to visit.    Allergies  Allergen Reactions   Macrobid [Nitrofurantoin Monohyd Macro] Hives, Shortness Of Breath and Itching    ROS Review of Systems  Constitutional:  Negative for chills and fever.  HENT:  Negative for congestion, sinus pressure, sinus pain and sore throat.   Eyes:  Negative for pain and discharge.  Respiratory:  Negative for cough and shortness of breath.   Cardiovascular:  Negative for chest pain and palpitations.  Gastrointestinal:  Positive for nausea. Negative for abdominal pain, constipation, diarrhea and vomiting.  Endocrine: Negative for polydipsia and polyuria.  Genitourinary:  Negative for dysuria and hematuria.  Musculoskeletal:  Negative for neck pain and neck stiffness.  Skin:  Negative for rash.  Neurological:  Negative for dizziness and weakness.  Psychiatric/Behavioral:  Negative for agitation and behavioral problems.       Objective:    Physical Exam Vitals reviewed.  Constitutional:      General: She is not in acute distress.    Appearance: She is obese. She is not diaphoretic.  HENT:     Head: Normocephalic and  atraumatic.     Nose: Nose normal.     Mouth/Throat:     Mouth: Mucous membranes are moist.  Eyes:     General: No scleral icterus.    Extraocular Movements: Extraocular movements intact.  Neck:     Thyroid: No thyroid tenderness.  Cardiovascular:     Rate and Rhythm: Normal rate and regular rhythm.     Pulses: Normal pulses.     Heart sounds: Normal heart sounds. No murmur heard. Pulmonary:     Breath sounds: Normal breath sounds. No wheezing or rales.  Abdominal:     Palpations: Abdomen is soft.     Tenderness: There is no abdominal tenderness.  Musculoskeletal:     Cervical back: Neck supple. No tenderness.     Right lower leg: No edema.     Left lower leg: No edema.  Skin:    General: Skin is warm.     Findings: No rash.  Neurological:     General: No focal deficit present.     Mental Status: She is alert and oriented to person, place, and time.     Cranial Nerves: No cranial nerve deficit.     Sensory: No sensory deficit.     Motor: No weakness.  Psychiatric:        Mood and Affect: Mood normal.        Behavior: Behavior normal.     BP 124/84 (BP Location: Left Arm, Patient Position: Sitting, Cuff Size: Normal)   Pulse 76   Resp 18   Ht 5' 5.5" (1.664 m)   Wt 206 lb (93.4 kg)   SpO2 99%   BMI 33.76 kg/m  Wt Readings from Last 3 Encounters:  11/08/21 206 lb (93.4 kg)  06/21/21 214 lb 12.8 oz (97.4 kg)  05/02/21 215 lb (97.5 kg)  Lab Results  Component Value Date   TSH 2.620 09/02/2020   Lab Results  Component Value Date   WBC 8.8 09/02/2020   HGB 14.2 09/02/2020   HCT 43.8 09/02/2020   MCV 94 09/02/2020   PLT 238 09/02/2020   Lab Results  Component Value Date   NA 140 03/22/2021   K 4.6 03/22/2021   CO2 26 03/22/2021   GLUCOSE 96 03/22/2021   BUN 12 03/22/2021   CREATININE 0.79 03/22/2021   BILITOT 0.5 03/22/2021   ALKPHOS 112 03/22/2021   AST 23 03/22/2021   ALT 30 03/22/2021   PROT 6.8 03/22/2021   ALBUMIN 4.2 03/22/2021   CALCIUM  9.8 03/22/2021   ANIONGAP 10 08/27/2019   EGFR 85 03/22/2021   Lab Results  Component Value Date   CHOL 189 03/22/2021   Lab Results  Component Value Date   HDL 37 (L) 03/22/2021   Lab Results  Component Value Date   LDLCALC 128 (H) 03/22/2021   Lab Results  Component Value Date   TRIG 135 03/22/2021   Lab Results  Component Value Date   CHOLHDL 5.1 (H) 03/22/2021   Lab Results  Component Value Date   HGBA1C 5.5 03/22/2021      Assessment & Plan:   Problem List Items Addressed This Visit       Digestive   GERD (gastroesophageal reflux disease)    Well controlled with Prilosec        Other   Morbid obesity ([place])    BMI Readings from Last 3 Encounters:  11/08/21 33.76 kg/m  06/21/21 35.20 kg/m  05/02/21 35.78 kg/m  Initial BMI - 37.54 when Wegovy started Diet modification and moderate exercise advised On Wegovy, tolerates it well      Encounter for general adult medical examination with abnormal findings - Primary    Physical exam as documented. Fasting blood tests today. Flu vaccine today. Wants to think about Shingrix vaccine.      Other Visit Diagnoses     Need for immunization against influenza       Relevant Orders   Flu Vaccine QUAD 35moIM (Fluarix, Fluzone & Alfiuria Quad PF) (Completed)       No orders of the defined types were placed in this encounter.   Follow-up: Return in about 6 months (around 05/10/2022) for Weight management.    RLindell Spar MD

## 2021-11-08 NOTE — Patient Instructions (Signed)
Please continue taking medications as prescribed.  Please continue to follow low carb diet and perform moderate exercise/walking at least 150 mins/week. 

## 2021-11-09 LAB — TSH: TSH: 3.39 u[IU]/mL (ref 0.450–4.500)

## 2021-11-09 LAB — CBC WITH DIFFERENTIAL/PLATELET
Basophils Absolute: 0.1 10*3/uL (ref 0.0–0.2)
Basos: 1 %
EOS (ABSOLUTE): 0.2 10*3/uL (ref 0.0–0.4)
Eos: 2 %
Hematocrit: 42.6 % (ref 34.0–46.6)
Hemoglobin: 13.6 g/dL (ref 11.1–15.9)
Immature Grans (Abs): 0 10*3/uL (ref 0.0–0.1)
Immature Granulocytes: 0 %
Lymphocytes Absolute: 1.8 10*3/uL (ref 0.7–3.1)
Lymphs: 23 %
MCH: 29.6 pg (ref 26.6–33.0)
MCHC: 31.9 g/dL (ref 31.5–35.7)
MCV: 93 fL (ref 79–97)
Monocytes Absolute: 0.7 10*3/uL (ref 0.1–0.9)
Monocytes: 9 %
Neutrophils Absolute: 5 10*3/uL (ref 1.4–7.0)
Neutrophils: 65 %
Platelets: 237 10*3/uL (ref 150–450)
RBC: 4.6 x10E6/uL (ref 3.77–5.28)
RDW: 12.3 % (ref 11.7–15.4)
WBC: 7.9 10*3/uL (ref 3.4–10.8)

## 2021-11-09 LAB — CMP14+EGFR
ALT: 22 IU/L (ref 0–32)
AST: 21 IU/L (ref 0–40)
Albumin/Globulin Ratio: 1.5 (ref 1.2–2.2)
Albumin: 3.9 g/dL (ref 3.9–4.9)
Alkaline Phosphatase: 97 IU/L (ref 44–121)
BUN/Creatinine Ratio: 13 (ref 12–28)
BUN: 11 mg/dL (ref 8–27)
Bilirubin Total: 0.5 mg/dL (ref 0.0–1.2)
CO2: 23 mmol/L (ref 20–29)
Calcium: 9.5 mg/dL (ref 8.7–10.3)
Chloride: 101 mmol/L (ref 96–106)
Creatinine, Ser: 0.84 mg/dL (ref 0.57–1.00)
Globulin, Total: 2.6 g/dL (ref 1.5–4.5)
Glucose: 82 mg/dL (ref 70–99)
Potassium: 4 mmol/L (ref 3.5–5.2)
Sodium: 138 mmol/L (ref 134–144)
Total Protein: 6.5 g/dL (ref 6.0–8.5)
eGFR: 79 mL/min/{1.73_m2} (ref >59)

## 2021-11-09 LAB — LIPID PANEL
Chol/HDL Ratio: 4.4 ratio (ref 0.0–4.4)
Cholesterol, Total: 185 mg/dL (ref 100–199)
HDL: 42 mg/dL (ref >39)
LDL Chol Calc (NIH): 124 mg/dL — ABNORMAL HIGH (ref 0–99)
Triglycerides: 104 mg/dL (ref 0–149)
VLDL Cholesterol Cal: 19 mg/dL (ref 5–40)

## 2021-11-09 LAB — HEMOGLOBIN A1C
Est. average glucose Bld gHb Est-mCnc: 111 mg/dL
Hgb A1c MFr Bld: 5.5 % (ref 4.8–5.6)

## 2021-11-09 LAB — VITAMIN D 25 HYDROXY (VIT D DEFICIENCY, FRACTURES): Vit D, 25-Hydroxy: 58.1 ng/mL (ref 30.0–100.0)

## 2021-11-15 ENCOUNTER — Encounter: Payer: Self-pay | Admitting: Internal Medicine

## 2021-11-15 ENCOUNTER — Ambulatory Visit (INDEPENDENT_AMBULATORY_CARE_PROVIDER_SITE_OTHER): Payer: 59 | Admitting: Internal Medicine

## 2021-11-15 DIAGNOSIS — R300 Dysuria: Secondary | ICD-10-CM | POA: Diagnosis not present

## 2021-11-15 DIAGNOSIS — N3000 Acute cystitis without hematuria: Secondary | ICD-10-CM | POA: Diagnosis not present

## 2021-11-15 LAB — POCT URINALYSIS DIP (CLINITEK)
Bilirubin, UA: NEGATIVE
Glucose, UA: 100 mg/dL — AB
Nitrite, UA: POSITIVE — AB
POC PROTEIN,UA: 30 — AB
Spec Grav, UA: 1.01 (ref 1.010–1.025)
Urobilinogen, UA: 1 E.U./dL
pH, UA: 6 (ref 5.0–8.0)

## 2021-11-15 MED ORDER — SULFAMETHOXAZOLE-TRIMETHOPRIM 800-160 MG PO TABS: 1.0000 | ORAL_TABLET | Freq: Two times a day (BID) | ORAL | 0 refills | Status: DC

## 2021-11-15 NOTE — Progress Notes (Signed)
Virtual Visit via Telephone Note   This visit type was conducted via telephone. This format is felt to be most appropriate for this patient at this time.  The patient did not have access to video technology/had technical difficulties with video requiring transitioning to audio format only (telephone).  All issues noted in this document were discussed and addressed.  No physical exam could be performed with this format.  Evaluation Performed:  Follow-up visit  Date:  11/15/2021   ID:  Amanda Higgins, DOB [birth year], MRN [id number]  Patient Location: Home Provider Location: Office/Clinic  Participants: Patient Location of Patient: Home Location of Provider: Telehealth Consent was obtain for visit to be over via telehealth. I verified that I am speaking with the correct person using two identifiers.  PCP:  Lindell Spar, MD   Chief Complaint: Dysuria  History of Present Illness:    Amanda Higgins is a 61 y.o. female who has a televisit for c/o dysuria, urinary urgency and urinary dribbling since last night.  She has tried taking Motrin and Azo with minimal relief.  Denies any fever, chills, nausea or vomiting currently.  She had an episode of UTI about 2 months ago, which was treated with Keflex by E-visit provider.  The patient does not have symptoms concerning for COVID-19 infection (fever, chills, cough, or new shortness of breath).   Past Medical, Surgical, Social History, Allergies, and Medications have been Reviewed.  Past Medical History:  Diagnosis Date   Anxiety    Phreesia 07/08/2019   Elevated cholesterol with elevated triglycerides 10/01/2015   Encounter for gynecological examination with Papanicolaou smear of cervix 04/17/2018   Encounter for well woman exam with routine gynecological exam 02/02/2017   Hematuria 12/11/2012   Hormone replacement therapy (HRT) 02/02/2017   Hx: UTI (urinary tract infection)    Left shoulder pain    Nerve impingement   Obesity     Pityriasis rosea 04/17/2018   PMB (postmenopausal bleeding) 09/24/2015   Usually at time of changing patches    Postmenopausal HRT (hormone replacement therapy) 12/17/2012   On combi patch   Screening for colorectal cancer 04/17/2018   Urinary frequency 07/11/2013   Urinary pain 12/11/2012   UTI (urinary tract infection) 11/29/2016   +Ecoli   Past Surgical History:  Procedure Laterality Date   CHOLECYSTECTOMY N/A    Phreesia 07/08/2019   COLONOSCOPY WITH ESOPHAGOGASTRODUODENOSCOPY (EGD) N/A 10/18/2012   Procedure: COLONOSCOPY WITH ESOPHAGOGASTRODUODENOSCOPY (EGD);  Surgeon: Daneil Dolin, MD;  [facility];  Service: Endoscopy;  Laterality: N/A;  9:45 AM   COLONOSCOPY WITH PROPOFOL N/A 05/02/2021   Procedure: COLONOSCOPY WITH PROPOFOL;  Surgeon: Daneil Dolin, MD;  [facility];  Service: Endoscopy;  Laterality: N/A;  9:45 / ASA 2   ESOPHAGOGASTRODUODENOSCOPY (EGD) WITH PROPOFOL N/A 05/02/2021   Procedure: ESOPHAGOGASTRODUODENOSCOPY (EGD) WITH PROPOFOL;  Surgeon: Daneil Dolin, MD;  [facility];  Service: Endoscopy;  Laterality: N/A;  9:45 / ASA 2   LAPAROSCOPIC CHOLECYSTECTOMY     POLYPECTOMY  05/02/2021   Procedure: POLYPECTOMY;  Surgeon: Daneil Dolin, MD;  [facility];  Service: Endoscopy;;  ascending colon     Current Meds  Medication Sig   acetaminophen (TYLENOL) 500 MG tablet Take 1,000 mg by mouth every 8 (eight) hours as needed for moderate pain.   Ascorbic Acid (VITAMIN C) 1000 MG tablet Take 1,000 mg by mouth daily.    Biotin 10000 MCG TABS Take  10,000 mcg by mouth at bedtime.   cholecalciferol (VITAMIN D) 1000 units tablet Take 1,000 Units by mouth 2 (two) times daily.   estradiol-levonorgestrel (CLIMARA PRO) 0.045-0.015 MG/DAY PLACE 1 PATCH ONTO THE SKIN ONCE A WEEK.   ibuprofen (ADVIL,MOTRIN) 200 MG tablet Take 600 mg by mouth every 8 (eight) hours as needed for headache or moderate pain.   omeprazole (PRILOSEC) 20 MG  capsule Take 1 capsule (20 mg total) by mouth daily.   Prenatal Vit-Fe Fumarate-FA (PRENATAL MULTIVITAMIN) TABS tablet Take 1 tablet by mouth at bedtime.   progesterone (PROMETRIUM) 200 MG capsule Take 1 capsule (200 mg total) by mouth daily.   Semaglutide-Weight Management (WEGOVY) 2.4 MG/0.75ML SOAJ Inject 2.4 mg into the skin once a week.   zinc gluconate 50 MG tablet Take 50 mg by mouth daily.     Allergies:   Macrobid [nitrofurantoin monohyd macro]   ROS:   Please see the history of present illness.     All other systems reviewed and are negative.   Labs/Other Tests and Data Reviewed:    Recent Labs: 11/08/2021: ALT 22; BUN 11; Creatinine, Ser 0.84; Hemoglobin 13.6; Platelets 237; Potassium 4.0; Sodium 138; TSH 3.390   Recent Lipid Panel Lab Results  Component Value Date/Time   CHOL 185 11/08/2021 08:09 AM   TRIG 104 11/08/2021 08:09 AM   HDL 42 11/08/2021 08:09 AM   CHOLHDL 4.4 11/08/2021 08:09 AM   CHOLHDL 4.9 08/27/2019 11:55 AM   LDLCALC 124 (H) 11/08/2021 08:09 AM   LDLCALC 110 (H) 03/28/2018 09:45 AM    Wt Readings from Last 3 Encounters:  11/08/21 206 lb (93.4 kg)  06/21/21 214 lb 12.8 oz (97.4 kg)  05/02/21 215 lb (97.5 kg)     ASSESSMENT & PLAN:    UTI UA reviewed - positive LE and nitrite Started empiric Bactrim Check urine culture Advised to maintain adequate hydration  Time:   Today, I have spent 9 minutes reviewing the chart, including problem list, medications, and with the patient with telehealth technology discussing the above problems.   Medication Adjustments/Labs and Tests Ordered: Current medicines are reviewed at length with the patient today.  Concerns regarding medicines are outlined above.   Tests Ordered: Orders Placed This Encounter  Procedures   Urine Culture   POCT URINALYSIS DIP (CLINITEK)    Medication Changes: No orders of the defined types were placed in this encounter.    Note: This dictation was prepared with  Dragon dictation along with smaller phrase technology. Similar sounding words can be transcribed inadequately or may not be corrected upon review. Any transcriptional errors that result from this process are unintentional.      Disposition:  Follow up  Signed, Lindell Spar, MD  11/15/2021 3:23 PM     [facility]

## 2021-11-17 ENCOUNTER — Other Ambulatory Visit (HOSPITAL_COMMUNITY): Payer: Self-pay

## 2021-11-17 ENCOUNTER — Other Ambulatory Visit: Payer: Self-pay | Admitting: Adult Health

## 2021-11-17 MED ORDER — CLIMARA PRO 0.045-0.015 MG/DAY TD PTWK
1.0000 | MEDICATED_PATCH | TRANSDERMAL | 2 refills | Status: DC
  Filled 2021-11-17: qty 4, 28d supply, fill #0
  Filled 2021-12-13: qty 4, 28d supply, fill #1
  Filled 2022-01-19 (×3): qty 4, 28d supply, fill #2

## 2021-11-18 LAB — URINE CULTURE

## 2021-11-28 ENCOUNTER — Encounter: Payer: Self-pay | Admitting: *Deleted

## 2021-11-28 ENCOUNTER — Encounter: Payer: Self-pay | Admitting: Internal Medicine

## 2021-12-13 ENCOUNTER — Other Ambulatory Visit (HOSPITAL_COMMUNITY): Payer: Self-pay

## 2022-01-02 DIAGNOSIS — Z760 Encounter for issue of repeat prescription: Secondary | ICD-10-CM | POA: Diagnosis not present

## 2022-01-06 DIAGNOSIS — S83241D Other tear of medial meniscus, current injury, right knee, subsequent encounter: Secondary | ICD-10-CM | POA: Diagnosis not present

## 2022-01-19 ENCOUNTER — Other Ambulatory Visit: Payer: Self-pay

## 2022-01-19 ENCOUNTER — Other Ambulatory Visit (HOSPITAL_COMMUNITY): Payer: Self-pay

## 2022-01-19 ENCOUNTER — Other Ambulatory Visit: Payer: Self-pay | Admitting: Internal Medicine

## 2022-01-19 DIAGNOSIS — E669 Obesity, unspecified: Secondary | ICD-10-CM

## 2022-01-19 MED ORDER — PRENATAL MULTIVITAMIN CH
1.0000 | ORAL_TABLET | Freq: Every day | ORAL | 2 refills | Status: DC
  Filled 2022-01-19: qty 30, 30d supply, fill #0
  Filled 2022-03-20 (×2): qty 30, 30d supply, fill #1
  Filled 2022-05-08: qty 30, 30d supply, fill #2
  Filled 2022-05-08: qty 30, 30d supply, fill #0

## 2022-01-19 MED ORDER — WEGOVY 2.4 MG/0.75ML ~~LOC~~ SOAJ
2.4000 mg | SUBCUTANEOUS | 3 refills | Status: DC
  Filled 2022-01-19: qty 3, 28d supply, fill #0
  Filled 2022-03-20: qty 3, 28d supply, fill #1
  Filled 2022-04-12: qty 3, 28d supply, fill #2
  Filled 2022-04-12: qty 3, 28d supply, fill #0
  Filled 2022-05-08: qty 3, 28d supply, fill #1

## 2022-01-20 ENCOUNTER — Other Ambulatory Visit (HOSPITAL_COMMUNITY): Payer: Self-pay

## 2022-01-23 ENCOUNTER — Encounter: Payer: Self-pay | Admitting: Adult Health

## 2022-01-23 ENCOUNTER — Other Ambulatory Visit (HOSPITAL_COMMUNITY)
Admission: RE | Admit: 2022-01-23 | Discharge: 2022-01-23 | Disposition: A | Discharge status: Home / Self Care | Payer: 59 | Source: Ambulatory Visit | Attending: Adult Health | Admitting: Adult Health

## 2022-01-23 ENCOUNTER — Ambulatory Visit (INDEPENDENT_AMBULATORY_CARE_PROVIDER_SITE_OTHER): Payer: 59 | Admitting: Adult Health

## 2022-01-23 VITALS — BP 117/81 | HR 81 | Ht 65.5 in | Wt 204.5 lb

## 2022-01-23 DIAGNOSIS — R309 Painful micturition, unspecified: Secondary | ICD-10-CM | POA: Diagnosis not present

## 2022-01-23 DIAGNOSIS — Z01419 Encounter for gynecological examination (general) (routine) without abnormal findings: Secondary | ICD-10-CM | POA: Insufficient documentation

## 2022-01-23 DIAGNOSIS — Z79890 Hormone replacement therapy (postmenopausal): Secondary | ICD-10-CM | POA: Diagnosis not present

## 2022-01-23 DIAGNOSIS — Z1211 Encounter for screening for malignant neoplasm of colon: Secondary | ICD-10-CM

## 2022-01-23 DIAGNOSIS — Z780 Asymptomatic menopausal state: Secondary | ICD-10-CM

## 2022-01-23 LAB — POCT URINALYSIS DIPSTICK
Blood, UA: NEGATIVE
Glucose, UA: NEGATIVE
Ketones, UA: NEGATIVE
Leukocytes, UA: NEGATIVE
Nitrite, UA: NEGATIVE
Protein, UA: NEGATIVE

## 2022-01-23 LAB — HEMOCCULT GUIAC POC 1CARD (OFFICE): Fecal Occult Blood, POC: NEGATIVE

## 2022-01-23 NOTE — Progress Notes (Signed)
Patient ID: SOLIMAR MAIDEN, female   DOB: 04/22/1960, 61 y.o.   MRN: [id number] History of Present Illness: [name] is a 61 year old white female,married, PM on HRT in for well woman gyn exam and pap. She had physical with PCP 11/08/21. She is on [facility] and has lost over 40 lbs.  PCP is Dr Posey Pronto.  Current Medications, Allergies, Past Medical History, Past Surgical History, Family History and Social History were reviewed in [employer] record.     Review of Systems: Patient denies any headaches, hearing loss, fatigue, blurred vision, shortness of breath, chest pain, abdominal pain, problems with bowel movements, or intercourse. No joint pain or mood swings.  Has some pain with urination recently Denies any vaginal bleeding    Physical Exam:BP 117/81 (BP Location: Left Arm, Patient Position: Sitting, Cuff Size: Normal)   Pulse 81   Ht 5' 5.5" (1.664 m)   Wt 204 lb 8 oz (92.8 kg)   BMI 33.51 kg/m  urine dipstick was negative General:  Well developed, well nourished, no acute distress Skin:  Warm and dry Neck:  Midline trachea, normal thyroid, good ROM, no lymphadenopathy,no carotid bruits heard Lungs; Clear to auscultation bilaterally Breast:  No dominant palpable mass, retraction, or nipple discharge Cardiovascular: Regular rate and rhythm Abdomen:  Soft, non tender, no hepatosplenomegaly Pelvic:  External genitalia is normal in appearance, no lesions.  The vagina is pale. Urethra has no lesions or masses. The cervix is smooth, pap with HR HPV genotyping performed.  Uterus is felt to be normal size, shape, and contour.  No adnexal masses or tenderness noted.Bladder is non tender, no masses felt. Rectal: Good sphincter tone, no polyps, or hemorrhoids felt.  Hemoccult negative. Extremities/musculoskeletal:  No swelling or varicosities noted, no clubbing or cyanosis Psych:  No mood changes, alert and cooperative,seems happy AA is 1 Fall risk is low    01/23/2022    11:38 AM 11/15/2021    3:02 PM 11/08/2021   10:27 AM  Depression screen PHQ 2/9  Decreased Interest 0 0 0  Down, Depressed, Hopeless 0 0 0  PHQ - 2 Score 0 0 0  Altered sleeping 1    Tired, decreased energy 0    Change in appetite 0    Feeling bad or failure about yourself  0    Trouble concentrating 0    Moving slowly or fidgety/restless 0    Suicidal thoughts 0    PHQ-9 Score 1         01/23/2022   11:38 AM 05/28/2019    1:43 PM  GAD 7 : Generalized Anxiety Score  Nervous, Anxious, on Edge 1 1  Control/stop worrying 0 0  Worry too much - different things 0 0  Trouble relaxing 0 1  Restless 0 0  Easily annoyed or irritable 0 1  Afraid - awful might happen 0 0  Total GAD 7 Score 1 3  Anxiety Difficulty  Not difficult at all    Upstream - 01/23/22 1138       Pregnancy Intention Screening   Does the patient want to become pregnant in the next year? N/A    Does the patient's partner want to become pregnant in the next year? N/A    Would the patient like to discuss contraceptive options today? N/A      Contraception Wrap Up   Current Method No Method - Other Reason   postmenopausal   Reason for No Current Contraceptive Method at Intake ([facility]  Only) Other    End Method No Method - Other Reason   postmenopausal   Contraception Counseling Provided No            Examination chaperoned by Levy Pupa LPN    Impression and Plan: 1. Encounter for gynecological examination with Papanicolaou smear of cervix Pap sent Pap in 3 years if normal Physical and labs with PCP Had mammogram in May 2023 Colonoscopy per GI  - Cytology - PAP( [facility])  2. Pain with urination - POCT Urinalysis Dipstick - Urine Culture - Urinalysis, Routine w reflex microscopic  3. Encounter for screening fecal occult blood testing Hemoccult was negative  - POCT occult blood stool  4. Postmenopause No bleeding   5. Hormone replacement therapy (HRT) On climara pro  may use every other  week, has refills  On Prometrium 200 mg at hs, has refills

## 2022-01-24 LAB — URINALYSIS, ROUTINE W REFLEX MICROSCOPIC
Bilirubin, UA: NEGATIVE
Glucose, UA: NEGATIVE
Ketones, UA: NEGATIVE
Leukocytes,UA: NEGATIVE
Nitrite, UA: NEGATIVE
Protein,UA: NEGATIVE
RBC, UA: NEGATIVE
Specific Gravity, UA: 1.009 (ref 1.005–1.030)
Urobilinogen, Ur: 0.2 mg/dL (ref 0.2–1.0)
pH, UA: 7.5 (ref 5.0–7.5)

## 2022-01-24 LAB — CYTOLOGY - PAP
Adequacy: ABSENT
Comment: NEGATIVE
Diagnosis: NEGATIVE
High risk HPV: NEGATIVE

## 2022-01-25 LAB — URINE CULTURE

## 2022-02-13 ENCOUNTER — Telehealth: Payer: Self-pay | Admitting: Adult Health

## 2022-02-13 MED ORDER — VALACYCLOVIR HCL 1 G PO TABS: ORAL_TABLET | ORAL | 3 refills | Status: DC

## 2022-02-13 NOTE — Telephone Encounter (Signed)
Has cold sore will send in rx for valtrex

## 2022-03-09 ENCOUNTER — Encounter: Payer: Self-pay | Admitting: Internal Medicine

## 2022-03-20 ENCOUNTER — Encounter: Payer: Self-pay | Admitting: Pharmacist

## 2022-03-20 ENCOUNTER — Other Ambulatory Visit (HOSPITAL_COMMUNITY): Payer: Self-pay

## 2022-03-20 ENCOUNTER — Other Ambulatory Visit: Payer: Self-pay | Admitting: Adult Health

## 2022-03-20 ENCOUNTER — Other Ambulatory Visit: Payer: Self-pay

## 2022-03-20 MED ORDER — CLIMARA PRO 0.045-0.015 MG/DAY TD PTWK
1.0000 | MEDICATED_PATCH | TRANSDERMAL | 6 refills | Status: DC
  Filled 2022-03-20 – 2022-05-15 (×3): qty 4, 28d supply, fill #0
  Filled 2022-06-12: qty 4, 28d supply, fill #1
  Filled 2022-09-11: qty 4, 28d supply, fill #2
  Filled 2022-10-23: qty 4, 28d supply, fill #3
  Filled 2022-11-22: qty 4, 28d supply, fill #4
  Filled 2023-01-10: qty 4, 28d supply, fill #5
  Filled 2023-02-05: qty 4, 28d supply, fill #6

## 2022-04-04 ENCOUNTER — Telehealth: Payer: Self-pay | Admitting: Adult Health

## 2022-04-04 MED ORDER — SULFAMETHOXAZOLE-TRIMETHOPRIM 800-160 MG PO TABS: 1.0000 | ORAL_TABLET | Freq: Two times a day (BID) | ORAL | 0 refills | Status: DC

## 2022-04-04 NOTE — Telephone Encounter (Signed)
Has UTI will rx septra ds

## 2022-04-05 ENCOUNTER — Other Ambulatory Visit (HOSPITAL_COMMUNITY): Payer: Self-pay

## 2022-04-12 ENCOUNTER — Other Ambulatory Visit (HOSPITAL_COMMUNITY): Payer: Self-pay

## 2022-04-12 ENCOUNTER — Encounter: Payer: Self-pay | Admitting: Internal Medicine

## 2022-05-08 ENCOUNTER — Other Ambulatory Visit: Payer: Self-pay

## 2022-05-08 ENCOUNTER — Other Ambulatory Visit (HOSPITAL_COMMUNITY): Payer: Self-pay

## 2022-05-09 ENCOUNTER — Ambulatory Visit (INDEPENDENT_AMBULATORY_CARE_PROVIDER_SITE_OTHER): Payer: 59 | Admitting: Internal Medicine

## 2022-05-09 ENCOUNTER — Encounter: Payer: Self-pay | Admitting: Internal Medicine

## 2022-05-09 VITALS — BP 119/78 | HR 83 | Temp 97.9°F | Ht 65.5 in | Wt 201.8 lb

## 2022-05-09 DIAGNOSIS — K2100 Gastro-esophageal reflux disease with esophagitis, without bleeding: Secondary | ICD-10-CM

## 2022-05-09 NOTE — Progress Notes (Unsigned)
Primary Care Physician:  Lindell Spar, MD Primary Gastroenterologist:  Dr. Gala Romney  Pre-Procedure History & Physical: HPI:  Amanda Higgins is a 62 y.o. female here for follow-up GERD and history colonic adenoma.  EGD and colonoscopy 1 year ago.  Mild erosive reflux esophagitis.  Omeprazole 20 mg daily controlling her reflux symptoms well.  No dysphagia.  On semaglutide for nearly a year now 50 pound weight loss achieved.  Some constipation some worsening of GERD at times but overall feeling much better.  Past Medical History:  Diagnosis Date   Anxiety    Phreesia 07/08/2019   Elevated cholesterol with elevated triglycerides 10/01/2015   Encounter for gynecological examination with Papanicolaou smear of cervix 04/17/2018   Encounter for well woman exam with routine gynecological exam 02/02/2017   Hematuria 12/11/2012   Hormone replacement therapy (HRT) 02/02/2017   Hx: UTI (urinary tract infection)    Left shoulder pain    Nerve impingement   Obesity    Pityriasis rosea 04/17/2018   PMB (postmenopausal bleeding) 09/24/2015   Usually at time of changing patches    Postmenopausal HRT (hormone replacement therapy) 12/17/2012   On combi patch   Screening for colorectal cancer 04/17/2018   Urinary frequency 07/11/2013   Urinary pain 12/11/2012   UTI (urinary tract infection) 11/29/2016   +Ecoli    Past Surgical History:  Procedure Laterality Date   CHOLECYSTECTOMY N/A    Phreesia 07/08/2019   COLONOSCOPY WITH ESOPHAGOGASTRODUODENOSCOPY (EGD) N/A 10/18/2012   Procedure: COLONOSCOPY WITH ESOPHAGOGASTRODUODENOSCOPY (EGD);  Surgeon: Daneil Dolin, MD;  [facility];  Service: Endoscopy;  Laterality: N/A;  9:45 AM   COLONOSCOPY WITH PROPOFOL N/A 05/02/2021   Procedure: COLONOSCOPY WITH PROPOFOL;  Surgeon: Daneil Dolin, MD;  [facility];  Service: Endoscopy;  Laterality: N/A;  9:45 / ASA 2   ESOPHAGOGASTRODUODENOSCOPY (EGD) WITH PROPOFOL N/A 05/02/2021   Procedure:  ESOPHAGOGASTRODUODENOSCOPY (EGD) WITH PROPOFOL;  Surgeon: Daneil Dolin, MD;  [facility];  Service: Endoscopy;  Laterality: N/A;  9:45 / ASA 2   LAPAROSCOPIC CHOLECYSTECTOMY     POLYPECTOMY  05/02/2021   Procedure: POLYPECTOMY;  Surgeon: Daneil Dolin, MD;  [facility];  Service: Endoscopy;;  ascending colon    Prior to Admission medications   Medication Sig Start Date End Date Taking? Authorizing Provider  acetaminophen (TYLENOL) 500 MG tablet Take 1,000 mg by mouth every 8 (eight) hours as needed for moderate pain.   Yes [provider]  Ascorbic Acid (VITAMIN C) 1000 MG tablet Take 1,000 mg by mouth daily.    Yes [provider]  Biotin 10000 MCG TABS Take 10,000 mcg by mouth at bedtime.   Yes [provider]  cholecalciferol (VITAMIN D) 1000 units tablet Take 1,000 Units by mouth 2 (two) times daily.   Yes [provider]  estradiol-levonorgestrel (CLIMARA PRO) 0.045-0.015 MG/DAY Place 1 patch onto the skin once a week. 03/20/22  Yes Derrek Monaco A, NP  ibuprofen (ADVIL,MOTRIN) 200 MG tablet Take 600 mg by mouth every 8 (eight) hours as needed for headache or moderate pain.   Yes [provider]  omeprazole (PRILOSEC) 20 MG capsule Take 1 capsule (20 mg total) by mouth daily. 09/09/21  Yes Lindell Spar, MD  Prenatal Vit-Fe Fumarate-FA (PRENATAL MULTIVITAMIN) TABS tablet Take 1 tablet by mouth at bedtime. 01/19/22  Yes Lindell Spar, MD  progesterone (PROMETRIUM) 200 MG capsule Take 1 capsule (200 mg total)  by mouth daily. 09/19/21  Yes Estill Dooms, NP  Semaglutide-Weight Management (WEGOVY) 2.4 MG/0.75ML SOAJ Inject 2.4 mg into the skin once a week. 01/19/22  Yes Lindell Spar, MD  zinc gluconate 50 MG tablet Take 50 mg by mouth daily.   Yes [provider]    Allergies as of 05/09/2022 - Review Complete 05/09/2022  Allergen Reaction Noted   Macrobid [nitrofurantoin monohyd macro] Hives,  Shortness Of Breath, and Itching 09/24/2015    Family History  Problem Relation Age of Onset   Hyperlipidemia Mother    Hypertension Mother    GER disease Mother    Heart disease Father        atrial fib   Hypertension Father    Arthritis Father        RA   Heart disease Paternal Grandmother        CHF   Hyperlipidemia Maternal Grandmother    Kidney disease Brother        kidney stones   Other Sister        precancerous breast lesion   Kidney disease Brother        kidney stones   Colon cancer Neg Hx     Social History   Socioeconomic History   Marital status: Married    Spouse name: Jeneen Rinks    Number of children: 2   Years of education: 16   Highest education level: Bachelor's degree (e.g., BA, AB, BS)  Occupational History   Not on file  Tobacco Use   Smoking status: Never   Smokeless tobacco: Never  Vaping Use   Vaping Use: Never used  Substance and Sexual Activity   Alcohol use: Yes    Comment: occasional wine   Drug use: No   Sexual activity: Not Currently    Birth control/protection: Post-menopausal  Other Topics Concern   Not on file  Social History Narrative   Lives with husband, Jeneen Rinks, married 9 years.      Blended family: each had two.   Two daughters   He had a daughter and son      Enjoys traveling: within [place], [place], [place]       Sunscreen and seat belt wearer   Well-balanced diet   Drinks water- does enjoy diet coke      Does take vitamin D and C         Social Determinants of Health   Financial Resource Strain: Low Risk  (05/08/2022)   Overall Financial Resource Strain (CARDIA)    Difficulty of Paying Living Expenses: Not hard at all  Food Insecurity: No Food Insecurity (05/08/2022)   Hunger Vital Sign    Worried About Running Out of Food in the Last Year: Never true    Ran Out of Food in the Last Year: Never true  Transportation Needs: No Transportation Needs (05/08/2022)   PRAPARE - Hydrologist  (Medical): No    Lack of Transportation (Non-Medical): No  Physical Activity: Insufficiently Active (05/08/2022)   Exercise Vital Sign    Days of Exercise per Week: 1 day    Minutes of Exercise per Session: 20 min  Stress: No Stress Concern Present (05/08/2022)   [place]    Feeling of Stress : Only a little  Social Connections: Socially Integrated (05/08/2022)   Social Connection and Isolation Panel [NHANES]    Frequency of Communication with Friends and Family: Three times a week  Frequency of Social Gatherings with Friends and Family: Twice a week    Attends Religious Services: More than 4 times per year    Active Member of [employer] or Organizations: No    Attends Music therapist: 1 to 4 times per year    Marital Status: Married  Human resources officer Violence: Not At Risk (01/23/2022)   Humiliation, Afraid, Rape, and Kick questionnaire    Fear of Current or Ex-Partner: No    Emotionally Abused: No    Physically Abused: No    Sexually Abused: No    Review of Systems: See HPI, otherwise negative ROS  Physical Exam: BP 119/78 (BP Location: Right Arm, Patient Position: Sitting, Cuff Size: Large)   Pulse 83   Temp 97.9 F (36.6 C) (Oral)   Ht 5' 5.5" (1.664 m)   Wt 201 lb 12.8 oz (91.5 kg)   SpO2 99%   BMI 33.07 kg/m  General:   Alert,  Well-developed, well-nourished, pleasant and cooperative in NAD Neck:  Supple; no masses or thyromegaly. No significant cervical adenopathy. Lungs:  Clear throughout to auscultation.   No wheezes, crackles, or rhonchi. No acute distress.   Impression/Plan: Pleasant 62 year old lady with longstanding GERD overall much improved with 50 pound weight loss on semaglutide.  Overall symptoms controlled on omeprazole 20 mg daily.  Prior history of colonic adenoma; due for surveillance colonoscopy 6 years from now.  Recommendations    GERD information provided  Continue  omeprazole 20 mg daily-best taken 30 minutes before breakfast.  Return office visit here as needed.  You are on the schedule for surveillance colonoscopy (history of polyp) in 6 years.  If you have any interim concerns or problems please do not hesitate to call me     Notice: This dictation was prepared with Dragon dictation along with smaller phrase technology. Any transcriptional errors that result from this process are unintentional and may not be corrected upon review.

## 2022-05-09 NOTE — Patient Instructions (Signed)
It was good to see you again today!  Congratulations on weight loss!  GERD information provided  Continue omeprazole 20 mg daily-best taken 30 minutes before breakfast.  Return office visit here as needed.  You are on the schedule for surveillance colonoscopy (history of polyp) in 6 years.  If you have any interim concerns or problems please do not hesitate to call me

## 2022-05-10 ENCOUNTER — Other Ambulatory Visit: Payer: Self-pay

## 2022-05-11 ENCOUNTER — Other Ambulatory Visit (HOSPITAL_COMMUNITY): Payer: Self-pay

## 2022-05-11 ENCOUNTER — Other Ambulatory Visit: Payer: Self-pay

## 2022-05-12 ENCOUNTER — Ambulatory Visit (INDEPENDENT_AMBULATORY_CARE_PROVIDER_SITE_OTHER): Payer: 59 | Admitting: Internal Medicine

## 2022-05-12 ENCOUNTER — Encounter: Payer: Self-pay | Admitting: Internal Medicine

## 2022-05-12 ENCOUNTER — Other Ambulatory Visit (HOSPITAL_COMMUNITY): Payer: Self-pay

## 2022-05-12 ENCOUNTER — Other Ambulatory Visit: Payer: Self-pay

## 2022-05-12 VITALS — BP 124/74 | HR 84 | Ht 65.5 in | Wt 200.4 lb

## 2022-05-12 DIAGNOSIS — E669 Obesity, unspecified: Secondary | ICD-10-CM

## 2022-05-12 DIAGNOSIS — R7303 Prediabetes: Secondary | ICD-10-CM | POA: Diagnosis not present

## 2022-05-12 DIAGNOSIS — E782 Mixed hyperlipidemia: Secondary | ICD-10-CM

## 2022-05-12 DIAGNOSIS — K2100 Gastro-esophageal reflux disease with esophagitis, without bleeding: Secondary | ICD-10-CM | POA: Diagnosis not present

## 2022-05-12 MED ORDER — WEGOVY 2.4 MG/0.75ML ~~LOC~~ SOAJ
2.4000 mg | SUBCUTANEOUS | 3 refills | Status: DC
  Filled 2022-05-12 (×3): qty 3, 28d supply, fill #0
  Filled 2022-06-07 – 2022-09-20 (×3): qty 3, 28d supply, fill #1
  Filled 2022-11-20 – 2022-11-22 (×2): qty 3, 28d supply, fill #2
  Filled 2023-01-10: qty 3, 28d supply, fill #3

## 2022-05-12 NOTE — Assessment & Plan Note (Signed)
Lab Results  Component Value Date   HGBA1C 5.5 11/08/2021   Advised to continue low-carb diet On Wegovy for weight loss benefit

## 2022-05-12 NOTE — Assessment & Plan Note (Signed)
Diet modification for now ?Check lipid profile ?

## 2022-05-12 NOTE — Patient Instructions (Signed)
Please continue taking medications as prescribed.  Please continue to follow low carb diet and perform moderate exercise/walking at least 150 mins/week. 

## 2022-05-12 NOTE — Assessment & Plan Note (Deleted)
BMI Readings from Last 3 Encounters:  05/12/22 32.84 kg/m  05/09/22 33.07 kg/m  01/23/22 33.51 kg/m   Initial BMI - 37.54 when Wegovy started Diet modification and moderate exercise advised On Wegovy, tolerates it well - has lost 32 lbs Check CMP, HbA1c and lipid profile

## 2022-05-12 NOTE — Assessment & Plan Note (Addendum)
BMI Readings from Last 3 Encounters:  05/12/22 32.84 kg/m  05/09/22 33.07 kg/m  01/23/22 33.51 kg/m   Initial BMI - 37.54 when Wegovy started Diet modification and moderate exercise advised On Wegovy, tolerates it well - has lost 32 lbs Had discussion about alternatives for [employer], she agrees to continue [facility] for now Check CMP, HbA1c and lipid profile

## 2022-05-12 NOTE — Assessment & Plan Note (Signed)
Well controlled with Prilosec 

## 2022-05-12 NOTE — Progress Notes (Signed)
Established Patient Office Visit  Subjective:  Patient ID: Amanda Higgins, female    DOB: 03/30/1960  Age: 62 y.o. MRN: [id number]  CC:  Chief Complaint  Patient presents with   Weight Loss    Follow up for weight loss.     HPI Amanda Higgins is a 62 y.o. female with past medical history of GAD, GERD and obesity who presents for f/u of her chronic medical conditions.  She has started using [facility] for weight loss.  She has been tolerating it well.  She has mild nausea, but is manageable currently. She has lost about 32 pounds since 11/22. She is going to lose coverage of Wegovy, but is willing to continue for now.  She had EGD, which showed erosive esophagitis. She needs to take Omeprazole QD, sometimes has to take BID for it. Denies dysphagia or odynophagia.   Past Medical History:  Diagnosis Date   Anxiety    Phreesia 07/08/2019   Elevated cholesterol with elevated triglycerides 10/01/2015   Encounter for gynecological examination with Papanicolaou smear of cervix 04/17/2018   Encounter for well woman exam with routine gynecological exam 02/02/2017   Hematuria 12/11/2012   Hormone replacement therapy (HRT) 02/02/2017   Hx: UTI (urinary tract infection)    Left shoulder pain    Nerve impingement   Obesity    Pityriasis rosea 04/17/2018   PMB (postmenopausal bleeding) 09/24/2015   Usually at time of changing patches    Postmenopausal HRT (hormone replacement therapy) 12/17/2012   On combi patch   Screening for colorectal cancer 04/17/2018   Urinary frequency 07/11/2013   Urinary pain 12/11/2012   UTI (urinary tract infection) 11/29/2016   +Ecoli    Past Surgical History:  Procedure Laterality Date   CHOLECYSTECTOMY N/A    Phreesia 07/08/2019   COLONOSCOPY WITH ESOPHAGOGASTRODUODENOSCOPY (EGD) N/A 10/18/2012   Procedure: COLONOSCOPY WITH ESOPHAGOGASTRODUODENOSCOPY (EGD);  Surgeon: Corbin Ade, MD;  [facility];  Service: Endoscopy;  Laterality: N/A;  9:45 AM    COLONOSCOPY WITH PROPOFOL N/A 05/02/2021   Procedure: COLONOSCOPY WITH PROPOFOL;  Surgeon: Corbin Ade, MD;  [facility];  Service: Endoscopy;  Laterality: N/A;  9:45 / ASA 2   ESOPHAGOGASTRODUODENOSCOPY (EGD) WITH PROPOFOL N/A 05/02/2021   Procedure: ESOPHAGOGASTRODUODENOSCOPY (EGD) WITH PROPOFOL;  Surgeon: Corbin Ade, MD;  [facility];  Service: Endoscopy;  Laterality: N/A;  9:45 / ASA 2   LAPAROSCOPIC CHOLECYSTECTOMY     POLYPECTOMY  05/02/2021   Procedure: POLYPECTOMY;  Surgeon: Corbin Ade, MD;  [facility];  Service: Endoscopy;;  ascending colon    Family History  Problem Relation Age of Onset   Hyperlipidemia Mother    Hypertension Mother    GER disease Mother    Heart disease Father        atrial fib   Hypertension Father    Arthritis Father        RA   Heart disease Paternal Grandmother        CHF   Hyperlipidemia Maternal Grandmother    Kidney disease Brother        kidney stones   Other Sister        precancerous breast lesion   Kidney disease Brother        kidney stones   Colon cancer Neg Hx     Social History   Socioeconomic History   Marital status: Married    Spouse name: Fayrene Fearing  Number of children: 2   Years of education: 16   Highest education level: Bachelor's degree (e.g., BA, AB, BS)  Occupational History   Not on file  Tobacco Use   Smoking status: Never   Smokeless tobacco: Never  Vaping Use   Vaping Use: Never used  Substance and Sexual Activity   Alcohol use: Yes    Comment: occasional wine   Drug use: No   Sexual activity: Not Currently    Birth control/protection: Post-menopausal  Other Topics Concern   Not on file  Social History Narrative   Lives with husband, Fayrene FearingJames, married 9 years.      Blended family: each had two.   Two daughters   He had a daughter and son      [place] traveling[place]       Sunscreen and seat belt wearer   Well-balanced diet    Drinks water- does enjoy diet coke      Does take vitamin D and C         Social Determinants of Health   Financial Resource Strain: Low Risk  (05/08/2022)   Overall Financial Resource Strain (CARDIA)    Difficulty of Paying Living Expenses: Not hard at all  Food Insecurity: No Food Insecurity (05/08/2022)   Hunger Vital Sign    Worried About Running Out of Food in the Last Year: Never true    Ran Out of Food in the Last Year: Never true  Transportation Needs: No Transportation Needs (05/08/2022)   PRAPARE - Administrator, Civil ServiceTransportation    Lack of Transportation (Medical): No    Lack of Transportation (Non-Medical): No  Physical Activity: Insufficiently Active (05/08/2022)   Exercise Vital Sign    Days of Exercise per Week: 1 day    Minutes of Exercise per Session: 20 min  Stress: No Stress Concern Present (05/08/2022)   [employer] - Occupational Stress Questionnaire    Feeling of Stress : Only a little  Social Connections: Socially Integrated (05/08/2022)   Social Connection and Isolation Panel [NHANES]    Frequency of Communication with Friends and Family: Three times a week    Frequency of Social Gatherings with Friends and Family: Twice a week    Attends Religious Services: More than 4 times per year    Active Member of [employer] or Organizations: No    Attends Engineer, structuralClub or Organization Meetings: 1 to 4 times per year    Marital Status: Married  Catering managerntimate Partner Violence: Not At Risk (01/23/2022)   Humiliation, Afraid, Rape, and Kick questionnaire    Fear of Current or Ex-Partner: No    Emotionally Abused: No    Physically Abused: No    Sexually Abused: No    Outpatient Medications Prior to Visit  Medication Sig Dispense Refill   acetaminophen (TYLENOL) 500 MG tablet Take 1,000 mg by mouth every 8 (eight) hours as needed for moderate pain.     Ascorbic Acid (VITAMIN C) 1000 MG tablet Take 1,000 mg by mouth daily.      Biotin 0981110000 MCG TABS Take 10,000 mcg by mouth at  bedtime.     cholecalciferol (VITAMIN D) 1000 units tablet Take 1,000 Units by mouth 2 (two) times daily.     estradiol-levonorgestrel (CLIMARA PRO) 0.045-0.015 MG/DAY Place 1 patch onto the skin once a week. 4 patch 6   ibuprofen (ADVIL,MOTRIN) 200 MG tablet Take 600 mg by mouth every 8 (eight) hours as needed for headache or moderate pain.  omeprazole (PRILOSEC) 20 MG capsule Take 1 capsule (20 mg total) by mouth daily. 90 capsule 3   Prenatal Vit-Fe Fumarate-FA (PRENATAL MULTIVITAMIN) TABS tablet Take 1 tablet by mouth at bedtime. 30 tablet 2   progesterone (PROMETRIUM) 200 MG capsule Take 1 capsule (200 mg total) by mouth daily. 90 capsule 2   zinc gluconate 50 MG tablet Take 50 mg by mouth daily.     Semaglutide-Weight Management (WEGOVY) 2.4 MG/0.75ML SOAJ Inject 2.4 mg into the skin once a week. 3 mL 3   No facility-administered medications prior to visit.    Allergies  Allergen Reactions   Macrobid [Nitrofurantoin Monohyd Macro] Hives, Shortness Of Breath and Itching    ROS Review of Systems  Constitutional:  Negative for chills and fever.  HENT:  Negative for congestion, sinus pressure, sinus pain and sore throat.   Eyes:  Negative for pain and discharge.  Respiratory:  Negative for cough and shortness of breath.   Cardiovascular:  Negative for chest pain and palpitations.  Gastrointestinal:  Positive for nausea. Negative for abdominal pain, constipation, diarrhea and vomiting.  Endocrine: Negative for polydipsia and polyuria.  Genitourinary:  Negative for dysuria and hematuria.  Musculoskeletal:  Negative for neck pain and neck stiffness.  Skin:  Negative for rash.  Neurological:  Negative for dizziness and weakness.  Psychiatric/Behavioral:  Negative for agitation and behavioral problems.       Objective:    Physical Exam Vitals reviewed.  Constitutional:      General: She is not in acute distress.    Appearance: She is obese. She is not diaphoretic.  HENT:      Head: Normocephalic and atraumatic.     Nose: Nose normal.     Mouth/Throat:     Mouth: Mucous membranes are moist.  Eyes:     General: No scleral icterus.    Extraocular Movements: Extraocular movements intact.  Neck:     Thyroid: Thyromegaly present. No thyroid tenderness.  Cardiovascular:     Rate and Rhythm: Normal rate and regular rhythm.     Pulses: Normal pulses.     Heart sounds: Normal heart sounds. No murmur heard. Pulmonary:     Breath sounds: Normal breath sounds. No wheezing or rales.  Musculoskeletal:     Cervical back: Neck supple. No tenderness.     Right lower leg: No edema.     Left lower leg: No edema.  Skin:    General: Skin is warm.     Findings: No rash.  Neurological:     General: No focal deficit present.     Mental Status: She is alert and oriented to person, place, and time.  Psychiatric:        Mood and Affect: Mood normal.        Behavior: Behavior normal.     BP 124/74 (BP Location: Right Arm, Patient Position: Sitting, Cuff Size: Normal)   Pulse 84   Ht 5' 5.5" (1.664 m)   Wt 200 lb 6.4 oz (90.9 kg)   SpO2 98%   BMI 32.84 kg/m  Wt Readings from Last 3 Encounters:  05/12/22 200 lb 6.4 oz (90.9 kg)  05/09/22 201 lb 12.8 oz (91.5 kg)  01/23/22 204 lb 8 oz (92.8 kg)    Lab Results  Component Value Date   TSH 3.390 11/08/2021   Lab Results  Component Value Date   WBC 7.9 11/08/2021   HGB 13.6 11/08/2021   HCT 42.6 11/08/2021   MCV 93 11/08/2021  PLT 237 11/08/2021   Lab Results  Component Value Date   NA 138 11/08/2021   K 4.0 11/08/2021   CO2 23 11/08/2021   GLUCOSE 82 11/08/2021   BUN 11 11/08/2021   CREATININE 0.84 11/08/2021   BILITOT 0.5 11/08/2021   ALKPHOS 97 11/08/2021   AST 21 11/08/2021   ALT 22 11/08/2021   PROT 6.5 11/08/2021   ALBUMIN 3.9 11/08/2021   CALCIUM 9.5 11/08/2021   ANIONGAP 10 08/27/2019   EGFR 79 11/08/2021   Lab Results  Component Value Date   CHOL 185 11/08/2021   Lab Results   Component Value Date   HDL 42 11/08/2021   Lab Results  Component Value Date   LDLCALC 124 (H) 11/08/2021   Lab Results  Component Value Date   TRIG 104 11/08/2021   Lab Results  Component Value Date   CHOLHDL 4.4 11/08/2021   Lab Results  Component Value Date   HGBA1C 5.5 11/08/2021      Assessment & Plan:   Problem List Items Addressed This Visit       Digestive   GERD (gastroesophageal reflux disease)    Well controlled with Prilosec        Other   HLD (hyperlipidemia)    Diet modification for now Check lipid profile      Relevant Orders   Lipid Profile   Prediabetes    Lab Results  Component Value Date   HGBA1C 5.5 11/08/2021  Advised to continue low-carb diet On Wegovy for weight loss benefit      Relevant Orders   CMP14+EGFR   Hemoglobin A1c   Obesity (BMI 30-39.9) - Primary    BMI Readings from Last 3 Encounters:  05/12/22 32.84 kg/m  05/09/22 33.07 kg/m  01/23/22 33.51 kg/m  Initial BMI - 37.54 when Wegovy started Diet modification and moderate exercise advised On Wegovy, tolerates it well - has lost 32 lbs Had discussion about alternatives for Wegovy, she agrees to continue [facility] for now Check CMP, HbA1c and lipid profile      Relevant Medications   Semaglutide-Weight Management (WEGOVY) 2.4 MG/0.75ML SOAJ   Meds ordered this encounter  Medications   Semaglutide-Weight Management (WEGOVY) 2.4 MG/0.75ML SOAJ    Sig: Inject 2.4 mg into the skin once a week.    Dispense:  3 mL    Refill:  3    Follow-up: Return in about 6 months (around 11/11/2022) for Annual physical.    Anabel Halon, MD

## 2022-05-13 ENCOUNTER — Other Ambulatory Visit (HOSPITAL_COMMUNITY): Payer: Self-pay

## 2022-05-15 ENCOUNTER — Other Ambulatory Visit: Payer: Self-pay

## 2022-05-15 ENCOUNTER — Other Ambulatory Visit (HOSPITAL_COMMUNITY): Payer: Self-pay

## 2022-05-20 ENCOUNTER — Telehealth: Payer: 59 | Admitting: Nurse Practitioner

## 2022-05-20 DIAGNOSIS — R399 Unspecified symptoms and signs involving the genitourinary system: Secondary | ICD-10-CM | POA: Diagnosis not present

## 2022-05-20 MED ORDER — CEPHALEXIN 500 MG PO CAPS: 500.0000 mg | ORAL_CAPSULE | Freq: Two times a day (BID) | ORAL | 0 refills | Status: AC

## 2022-05-20 NOTE — Progress Notes (Signed)
I have spent 5 minutes in review of e-visit questionnaire, review and updating patient chart, medical decision making and response to patient.  ° °Cung Masterson W Latoyia Tecson, NP ° °  °

## 2022-05-20 NOTE — Progress Notes (Signed)

## 2022-05-22 DIAGNOSIS — R7303 Prediabetes: Secondary | ICD-10-CM | POA: Diagnosis not present

## 2022-05-22 DIAGNOSIS — E782 Mixed hyperlipidemia: Secondary | ICD-10-CM | POA: Diagnosis not present

## 2022-05-23 ENCOUNTER — Telehealth: Payer: Self-pay | Admitting: Internal Medicine

## 2022-05-23 LAB — LIPID PANEL
Chol/HDL Ratio: 4.4 ratio (ref 0.0–4.4)
Cholesterol, Total: 190 mg/dL (ref 100–199)
HDL: 43 mg/dL (ref >39)
LDL Chol Calc (NIH): 124 mg/dL — ABNORMAL HIGH (ref 0–99)
Triglycerides: 129 mg/dL (ref 0–149)
VLDL Cholesterol Cal: 23 mg/dL (ref 5–40)

## 2022-05-23 LAB — CMP14+EGFR
ALT: 19 IU/L (ref 0–32)
AST: 18 IU/L (ref 0–40)
Albumin/Globulin Ratio: 1.4 (ref 1.2–2.2)
Albumin: 3.9 g/dL (ref 3.9–4.9)
Alkaline Phosphatase: 96 IU/L (ref 44–121)
BUN/Creatinine Ratio: 14 (ref 12–28)
BUN: 11 mg/dL (ref 8–27)
Bilirubin Total: 0.4 mg/dL (ref 0.0–1.2)
CO2: 24 mmol/L (ref 20–29)
Calcium: 9.7 mg/dL (ref 8.7–10.3)
Chloride: 102 mmol/L (ref 96–106)
Creatinine, Ser: 0.77 mg/dL (ref 0.57–1.00)
Globulin, Total: 2.8 g/dL (ref 1.5–4.5)
Glucose: 98 mg/dL (ref 70–99)
Potassium: 4.1 mmol/L (ref 3.5–5.2)
Sodium: 140 mmol/L (ref 134–144)
Total Protein: 6.7 g/dL (ref 6.0–8.5)
eGFR: 87 mL/min/{1.73_m2} (ref >59)

## 2022-05-23 LAB — HEMOGLOBIN A1C
Est. average glucose Bld gHb Est-mCnc: 114 mg/dL
Hgb A1c MFr Bld: 5.6 % (ref 4.8–5.6)

## 2022-05-23 NOTE — Telephone Encounter (Signed)
Patient returning a call for lab results. °

## 2022-05-23 NOTE — Telephone Encounter (Signed)
Spoke to patient

## 2022-06-07 ENCOUNTER — Other Ambulatory Visit: Payer: Self-pay

## 2022-06-12 ENCOUNTER — Other Ambulatory Visit: Payer: Self-pay | Admitting: Adult Health

## 2022-06-12 ENCOUNTER — Other Ambulatory Visit: Payer: Self-pay

## 2022-06-12 MED ORDER — PROGESTERONE 200 MG PO CAPS
200.0000 mg | ORAL_CAPSULE | Freq: Every day | ORAL | 2 refills | Status: DC
  Filled 2022-06-12: qty 90, 90d supply, fill #0
  Filled 2022-10-23: qty 90, 90d supply, fill #1
  Filled 2023-02-05: qty 90, 90d supply, fill #2

## 2022-06-16 DIAGNOSIS — R300 Dysuria: Secondary | ICD-10-CM | POA: Diagnosis not present

## 2022-07-24 ENCOUNTER — Other Ambulatory Visit (HOSPITAL_COMMUNITY): Payer: Self-pay | Admitting: Internal Medicine

## 2022-07-24 DIAGNOSIS — Z1231 Encounter for screening mammogram for malignant neoplasm of breast: Secondary | ICD-10-CM

## 2022-07-26 ENCOUNTER — Ambulatory Visit (HOSPITAL_COMMUNITY)
Admission: RE | Admit: 2022-07-26 | Discharge: 2022-07-26 | Disposition: A | Discharge status: Home / Self Care | Payer: 59 | Source: Ambulatory Visit | Attending: Internal Medicine | Admitting: Internal Medicine

## 2022-07-26 DIAGNOSIS — Z1231 Encounter for screening mammogram for malignant neoplasm of breast: Secondary | ICD-10-CM | POA: Diagnosis not present

## 2022-08-16 DIAGNOSIS — M25561 Pain in right knee: Secondary | ICD-10-CM | POA: Diagnosis not present

## 2022-09-11 ENCOUNTER — Other Ambulatory Visit (HOSPITAL_COMMUNITY): Payer: Self-pay

## 2022-09-11 ENCOUNTER — Other Ambulatory Visit: Payer: Self-pay | Admitting: Internal Medicine

## 2022-09-11 ENCOUNTER — Other Ambulatory Visit: Payer: Self-pay

## 2022-09-11 MED ORDER — OMEPRAZOLE 20 MG PO CPDR
20.0000 mg | DELAYED_RELEASE_CAPSULE | Freq: Every day | ORAL | 3 refills | Status: DC
  Filled 2022-09-11: qty 90, 90d supply, fill #0
  Filled 2023-01-10: qty 90, 90d supply, fill #1
  Filled 2023-04-10: qty 90, 90d supply, fill #2
  Filled 2023-07-05: qty 90, 90d supply, fill #3

## 2022-09-12 ENCOUNTER — Other Ambulatory Visit: Payer: Self-pay

## 2022-09-20 ENCOUNTER — Other Ambulatory Visit (HOSPITAL_COMMUNITY): Payer: Self-pay

## 2022-09-21 ENCOUNTER — Other Ambulatory Visit: Payer: Self-pay

## 2022-10-24 ENCOUNTER — Other Ambulatory Visit: Payer: Self-pay

## 2022-10-24 ENCOUNTER — Other Ambulatory Visit (HOSPITAL_COMMUNITY): Payer: Self-pay

## 2022-10-25 ENCOUNTER — Other Ambulatory Visit: Payer: Self-pay

## 2022-10-26 ENCOUNTER — Other Ambulatory Visit (HOSPITAL_BASED_OUTPATIENT_CLINIC_OR_DEPARTMENT_OTHER): Payer: Self-pay

## 2022-10-26 ENCOUNTER — Encounter (HOSPITAL_COMMUNITY): Payer: Self-pay

## 2022-10-27 ENCOUNTER — Other Ambulatory Visit (HOSPITAL_COMMUNITY): Payer: Self-pay

## 2022-10-30 ENCOUNTER — Other Ambulatory Visit: Payer: Self-pay

## 2022-10-30 ENCOUNTER — Other Ambulatory Visit (HOSPITAL_COMMUNITY): Payer: Self-pay

## 2022-10-30 ENCOUNTER — Encounter: Payer: Self-pay | Admitting: Pharmacist

## 2022-11-16 ENCOUNTER — Encounter: Payer: Self-pay | Admitting: Internal Medicine

## 2022-11-16 ENCOUNTER — Ambulatory Visit (INDEPENDENT_AMBULATORY_CARE_PROVIDER_SITE_OTHER): Payer: 59 | Admitting: Internal Medicine

## 2022-11-16 VITALS — BP 121/81 | HR 82 | Ht 65.5 in | Wt 190.4 lb

## 2022-11-16 DIAGNOSIS — I8393 Asymptomatic varicose veins of bilateral lower extremities: Secondary | ICD-10-CM

## 2022-11-16 DIAGNOSIS — Z0001 Encounter for general adult medical examination with abnormal findings: Secondary | ICD-10-CM | POA: Diagnosis not present

## 2022-11-16 DIAGNOSIS — E041 Nontoxic single thyroid nodule: Secondary | ICD-10-CM | POA: Diagnosis not present

## 2022-11-16 DIAGNOSIS — Z23 Encounter for immunization: Secondary | ICD-10-CM | POA: Diagnosis not present

## 2022-11-16 DIAGNOSIS — R7303 Prediabetes: Secondary | ICD-10-CM | POA: Diagnosis not present

## 2022-11-16 DIAGNOSIS — E782 Mixed hyperlipidemia: Secondary | ICD-10-CM | POA: Diagnosis not present

## 2022-11-16 DIAGNOSIS — K2100 Gastro-esophageal reflux disease with esophagitis, without bleeding: Secondary | ICD-10-CM

## 2022-11-16 DIAGNOSIS — E669 Obesity, unspecified: Secondary | ICD-10-CM | POA: Diagnosis not present

## 2022-11-16 DIAGNOSIS — E559 Vitamin D deficiency, unspecified: Secondary | ICD-10-CM

## 2022-11-16 NOTE — Assessment & Plan Note (Signed)
Well controlled with Prilosec 20 mg PRN

## 2022-11-16 NOTE — Assessment & Plan Note (Signed)
Physical exam as documented. Fasting blood tests today. Flu vaccine today. Wants to think about Shingrix vaccine.

## 2022-11-16 NOTE — Progress Notes (Signed)
Established Patient Office Visit  Subjective:  Patient ID: Amanda Higgins, female    DOB: [birth year]  Age: 62 y.o. MRN: [id number]  CC:  Chief Complaint  Patient presents with   Annual Exam    HPI Amanda Higgins is a 62 y.o. female with past medical history of GAD, GERD and obesity who presents for annual physical.  She has been using Wegovy 2.4 mg qw for weight loss, but has to take it every other week - has to pay out-of-pocket.  She has been tolerating it well.  She has mild nausea, but is manageable currently. She has lost about 42 pounds since 11/22, but she was on Rybelsus at that time.  She has noticed improvement in her back pain and knee pain.  She has been more active and has started cycling again.  She had EGD in 03/23, which showed erosive esophagitis. She needs to take Omeprazole QD PRN. Denies dysphagia or odynophagia.    Past Medical History:  Diagnosis Date   Anxiety    Phreesia 07/08/2019   Elevated cholesterol with elevated triglycerides 10/01/2015   Encounter for gynecological examination with Papanicolaou smear of cervix 04/17/2018   Encounter for well woman exam with routine gynecological exam 02/02/2017   Hematuria 12/11/2012   Hormone replacement therapy (HRT) 02/02/2017   Hx: UTI (urinary tract infection)    Left shoulder pain    Nerve impingement   Obesity    Pityriasis rosea 04/17/2018   PMB (postmenopausal bleeding) 09/24/2015   Usually at time of changing patches    Postmenopausal HRT (hormone replacement therapy) 12/17/2012   On combi patch   Screening for colorectal cancer 04/17/2018   Urinary frequency 07/11/2013   Urinary pain 12/11/2012   UTI (urinary tract infection) 11/29/2016   +Ecoli    Past Surgical History:  Procedure Laterality Date   CHOLECYSTECTOMY N/A    Phreesia 07/08/2019   COLONOSCOPY WITH ESOPHAGOGASTRODUODENOSCOPY (EGD) N/A 10/18/2012   Procedure: COLONOSCOPY WITH ESOPHAGOGASTRODUODENOSCOPY (EGD);  Surgeon: Corbin Ade,  MD;  [facility]: [facility];  Service: Endoscopy;  Laterality: N/A;  9:45 AM   COLONOSCOPY WITH PROPOFOL N/A 05/02/2021   Procedure: COLONOSCOPY WITH PROPOFOL;  Surgeon: Corbin Ade, MD;  Location: [facility];  Service: Endoscopy;  Laterality: N/A;  9:45 / ASA 2   ESOPHAGOGASTRODUODENOSCOPY (EGD) WITH PROPOFOL N/A 05/02/2021   Procedure: ESOPHAGOGASTRODUODENOSCOPY (EGD) WITH PROPOFOL;  Surgeon: Corbin Ade, MD;  Location: [facility];  Service: Endoscopy;  Laterality: N/A;  9:45 / ASA 2   LAPAROSCOPIC CHOLECYSTECTOMY     POLYPECTOMY  05/02/2021   Procedure: POLYPECTOMY;  Surgeon: Corbin Ade, MD;  Location: [facility];  Service: Endoscopy;;  ascending colon    Family History  Problem Relation Age of Onset   Hyperlipidemia Mother    Hypertension Mother    GER disease Mother    Heart disease Father        atrial fib   Hypertension Father    Arthritis Father        RA   Heart disease Paternal Grandmother        CHF   Hyperlipidemia Maternal Grandmother    Kidney disease Brother        kidney stones   Other Sister        precancerous breast lesion   Kidney disease Brother        kidney stones   Colon cancer Neg Hx     Social History  Socioeconomic History   Marital status: Married    Spouse name: Fayrene Fearing    Number of children: 2   Years of education: 16   Highest education level: Bachelor's degree (e.g., BA, AB, BS)  Occupational History   Not on file  Tobacco Use   Smoking status: Never   Smokeless tobacco: Never  Vaping Use   Vaping status: Never Used  Substance and Sexual Activity   Alcohol use: Yes    Comment: occasional wine   Drug use: No   Sexual activity: Not Currently    Birth control/protection: Post-menopausal  Other Topics Concern   Not on file  Social History Narrative   Lives with husband, Fayrene Fearing, married 9 years.      Blended family: each had two.   Two daughters   He had a daughter and son      Enjoys traveling: within [place],  [place], [place]       Sunscreen and seat belt wearer   Well-balanced diet   Drinks water- does enjoy diet coke      Does take vitamin D and C         Social Determinants of Health   Financial Resource Strain: Low Risk  (05/08/2022)   Overall Financial Resource Strain (CARDIA)    Difficulty of Paying Living Expenses: Not hard at all  Food Insecurity: No Food Insecurity (05/08/2022)   Hunger Vital Sign    Worried About Running Out of Food in the Last Year: Never true    Ran Out of Food in the Last Year: Never true  Transportation Needs: No Transportation Needs (05/08/2022)   PRAPARE - Administrator, Civil Service (Medical): No    Lack of Transportation (Non-Medical): No  Physical Activity: Insufficiently Active (05/08/2022)   Exercise Vital Sign    Days of Exercise per Week: 1 day    Minutes of Exercise per Session: 20 min  Stress: No Stress Concern Present (05/08/2022)   [employer] of Occupational Health - Occupational Stress Questionnaire    Feeling of Stress : Only a little  Social Connections: Socially Integrated (05/08/2022)   Social Connection and Isolation Panel [NHANES]    Frequency of Communication with Friends and Family: Three times a week    Frequency of Social Gatherings with Friends and Family: Twice a week    Attends Religious Services: More than 4 times per year    Active Member of [employer] or Organizations: No    Attends Engineer, structural: 1 to 4 times per year    Marital Status: Married  Catering manager Violence: Not At Risk (01/23/2022)   Humiliation, Afraid, Rape, and Kick questionnaire    Fear of Current or Ex-Partner: No    Emotionally Abused: No    Physically Abused: No    Sexually Abused: No    Outpatient Medications Prior to Visit  Medication Sig Dispense Refill   acetaminophen (TYLENOL) 500 MG tablet Take 1,000 mg by mouth every 8 (eight) hours as needed for moderate pain.     Ascorbic Acid (VITAMIN C) 1000 MG tablet  Take 1,000 mg by mouth daily.      cholecalciferol (VITAMIN D) 1000 units tablet Take 1,000 Units by mouth 2 (two) times daily.     estradiol-levonorgestrel (CLIMARA PRO) 0.045-0.015 MG/DAY Place 1 patch onto the skin once a week. 4 patch 6   omeprazole (PRILOSEC) 20 MG capsule Take 1 capsule (20 mg total) by mouth daily. 90 capsule 3   progesterone (  PROMETRIUM) 200 MG capsule Take 1 capsule (200 mg total) by mouth daily. 90 capsule 2   Semaglutide-Weight Management (WEGOVY) 2.4 MG/0.75ML SOAJ Inject 2.4 mg into the skin once a week. 3 mL 3   zinc gluconate 50 MG tablet Take 50 mg by mouth daily.     Biotin 91478 MCG TABS Take 10,000 mcg by mouth at bedtime.     ibuprofen (ADVIL,MOTRIN) 200 MG tablet Take 600 mg by mouth every 8 (eight) hours as needed for headache or moderate pain.     Prenatal Vit-Fe Fumarate-FA (PRENATAL MULTIVITAMIN) TABS tablet Take 1 tablet by mouth at bedtime. 30 tablet 2   No facility-administered medications prior to visit.    Allergies  Allergen Reactions   Macrobid [Nitrofurantoin Monohyd Macro] Hives, Shortness Of Breath and Itching    ROS Review of Systems  Constitutional:  Negative for chills and fever.  HENT:  Negative for congestion, sinus pressure, sinus pain and sore throat.   Eyes:  Negative for pain and discharge.  Respiratory:  Negative for cough and shortness of breath.   Cardiovascular:  Negative for chest pain and palpitations.  Gastrointestinal:  Negative for abdominal pain, constipation, diarrhea and vomiting.  Endocrine: Negative for polydipsia and polyuria.  Genitourinary:  Negative for dysuria and hematuria.  Musculoskeletal:  Negative for neck pain and neck stiffness.  Skin:  Negative for rash.  Neurological:  Negative for dizziness and weakness.  Psychiatric/Behavioral:  Negative for agitation and behavioral problems.       Objective:    Physical Exam Vitals reviewed.  Constitutional:      General: She is not in acute  distress.    Appearance: She is obese. She is not diaphoretic.  HENT:     Head: Normocephalic and atraumatic.     Nose: Nose normal.     Mouth/Throat:     Mouth: Mucous membranes are moist.  Eyes:     General: No scleral icterus.    Extraocular Movements: Extraocular movements intact.  Neck:     Thyroid: No thyroid tenderness.  Cardiovascular:     Rate and Rhythm: Normal rate and regular rhythm.     Pulses: Normal pulses.     Heart sounds: Normal heart sounds. No murmur heard. Pulmonary:     Breath sounds: Normal breath sounds. No wheezing or rales.  Abdominal:     Palpations: Abdomen is soft.     Tenderness: There is no abdominal tenderness.  Musculoskeletal:     Cervical back: Neck supple. No tenderness.     Right lower leg: No edema.     Left lower leg: No edema.  Skin:    General: Skin is warm.     Findings: No rash.     Comments: Varicose veins of b/l LE  Neurological:     General: No focal deficit present.     Mental Status: She is alert and oriented to person, place, and time.     Cranial Nerves: No cranial nerve deficit.     Sensory: No sensory deficit.     Motor: No weakness.  Psychiatric:        Mood and Affect: Mood normal.        Behavior: Behavior normal.     BP 121/81 (BP Location: Right Arm, Patient Position: Sitting, Cuff Size: Normal)   Pulse 82   Ht 5' 5.5" (1.664 m)   Wt 190 lb 6.4 oz (86.4 kg)   SpO2 98%   BMI 31.20 kg/m  Wt Readings from  Last 3 Encounters:  11/16/22 190 lb 6.4 oz (86.4 kg)  05/12/22 200 lb 6.4 oz (90.9 kg)  05/09/22 201 lb 12.8 oz (91.5 kg)    Lab Results  Component Value Date   TSH 3.390 11/08/2021   Lab Results  Component Value Date   WBC 7.9 11/08/2021   HGB 13.6 11/08/2021   HCT 42.6 11/08/2021   MCV 93 11/08/2021   PLT 237 11/08/2021   Lab Results  Component Value Date   NA 140 05/22/2022   K 4.1 05/22/2022   CO2 24 05/22/2022   GLUCOSE 98 05/22/2022   BUN 11 05/22/2022   CREATININE 0.77 05/22/2022    BILITOT 0.4 05/22/2022   ALKPHOS 96 05/22/2022   AST 18 05/22/2022   ALT 19 05/22/2022   PROT 6.7 05/22/2022   ALBUMIN 3.9 05/22/2022   CALCIUM 9.7 05/22/2022   ANIONGAP 10 08/27/2019   EGFR 87 05/22/2022   Lab Results  Component Value Date   CHOL 190 05/22/2022   Lab Results  Component Value Date   HDL 43 05/22/2022   Lab Results  Component Value Date   LDLCALC 124 (H) 05/22/2022   Lab Results  Component Value Date   TRIG 129 05/22/2022   Lab Results  Component Value Date   CHOLHDL 4.4 05/22/2022   Lab Results  Component Value Date   HGBA1C 5.6 05/22/2022      Assessment & Plan:   Problem List Items Addressed This Visit       Cardiovascular and Mediastinum   Asymptomatic varicose veins of both lower extremities    Chronic Does not have leg swelling Leg elevation as needed for leg swelling She has had laser therapy in the past      Relevant Orders   CMP14+EGFR   CBC with Differential/Platelet     Digestive   GERD (gastroesophageal reflux disease)    Well controlled with Prilosec 20 mg PRN        Endocrine   Thyroid nodule    Checked US thyroid - has 2 small nodules - 1.1 cm and 0.8 cm, benign TSH has remained WNL      Relevant Orders   TSH     Other   HLD (hyperlipidemia)    Diet modification for now Check lipid profile      Relevant Orders   Lipid panel   Encounter for general adult medical examination with abnormal findings - Primary    Physical exam as documented. Fasting blood tests today. Flu vaccine today. Wants to think about Shingrix vaccine.      Vitamin D deficiency   Relevant Orders   VITAMIN D 25 Hydroxy (Vit-D Deficiency, Fractures)   Prediabetes    Lab Results  Component Value Date   HGBA1C 5.6 05/22/2022   Advised to continue low-carb diet On Wegovy for weight loss benefit      Relevant Orders   Hemoglobin A1c   CMP14+EGFR   Obesity (BMI 30-39.9)    BMI Readings from Last 3 Encounters:  11/16/22 31.20  kg/m  05/12/22 32.84 kg/m  05/09/22 33.07 kg/m   Initial BMI - 37.54 when Wegovy started Diet modification and moderate exercise advised On Wegovy, tolerates it well - has lost 42 lbs Had discussion about alternatives for Wegovy, she agrees to continue [facility] for now Check CMP, HbA1c and lipid profile      Other Visit Diagnoses     Encounter for immunization       Relevant Orders   Flu  vaccine trivalent PF, 6mos and older(Flulaval,Afluria,Fluarix,Fluzone) (Completed)       No orders of the defined types were placed in this encounter.   Follow-up: Return in about 6 months (around 05/17/2023) for Weight management.    Anabel Halon, MD

## 2022-11-16 NOTE — Assessment & Plan Note (Signed)
Diet modification for now ?Check lipid profile ?

## 2022-11-16 NOTE — Assessment & Plan Note (Addendum)
Chronic Does not have leg swelling Leg elevation as needed for leg swelling She has had laser therapy in the past

## 2022-11-16 NOTE — Assessment & Plan Note (Addendum)
Checked US thyroid - has 2 small nodules - 1.1 cm and 0.8 cm, benign TSH has remained WNL

## 2022-11-16 NOTE — Assessment & Plan Note (Addendum)
BMI Readings from Last 3 Encounters:  11/16/22 31.20 kg/m  05/12/22 32.84 kg/m  05/09/22 33.07 kg/m   Initial BMI - 37.54 when Wegovy started Diet modification and moderate exercise advised On Wegovy, tolerates it well - has lost 42 lbs Had discussion about alternatives for [facility], she agrees to continue [facility] for now Check CMP, HbA1c and lipid profile

## 2022-11-16 NOTE — Patient Instructions (Addendum)
Please continue to take medications as prescribed. ? ?Please continue to follow low carb diet and perform moderate exercise/walking at least 150 mins/week. ?

## 2022-11-16 NOTE — Assessment & Plan Note (Signed)
Lab Results  Component Value Date   HGBA1C 5.6 05/22/2022   Advised to continue low-carb diet On Wegovy for weight loss benefit

## 2022-11-20 ENCOUNTER — Telehealth: Payer: Self-pay | Admitting: Adult Health

## 2022-11-20 ENCOUNTER — Other Ambulatory Visit (HOSPITAL_COMMUNITY): Payer: Self-pay

## 2022-11-20 ENCOUNTER — Other Ambulatory Visit: Payer: Self-pay

## 2022-11-20 MED ORDER — CIPROFLOXACIN HCL 500 MG PO TABS: 500.0000 mg | ORAL_TABLET | Freq: Two times a day (BID) | ORAL | 0 refills | Status: DC

## 2022-11-20 NOTE — Telephone Encounter (Signed)
Has UTI symptoms will rx cipro

## 2022-11-22 ENCOUNTER — Other Ambulatory Visit: Payer: Self-pay

## 2023-01-11 ENCOUNTER — Other Ambulatory Visit: Payer: Self-pay

## 2023-02-05 ENCOUNTER — Other Ambulatory Visit (HOSPITAL_BASED_OUTPATIENT_CLINIC_OR_DEPARTMENT_OTHER): Payer: Self-pay

## 2023-02-05 ENCOUNTER — Other Ambulatory Visit: Payer: Self-pay

## 2023-02-14 IMAGING — MG MM DIGITAL DIAGNOSTIC UNILAT*L* W/ TOMO W/ CAD
4 series · 4 of 12 positions shown · non-contrast
Comparison: Previous exam(s) dating back to 1454.

CLINICAL DATA: 60-year-old female for further evaluation of
possible LEFT breast asymmetry on screening mammogram.

EXAM:
DIGITAL DIAGNOSTIC UNILATERAL LEFT MAMMOGRAM WITH TOMOSYNTHESIS AND
CAD
TECHNIQUE: Left digital diagnostic mammography and breast tomosynthesis was
performed. The images were evaluated with computer-aided detection.

[L ML synth-2D]
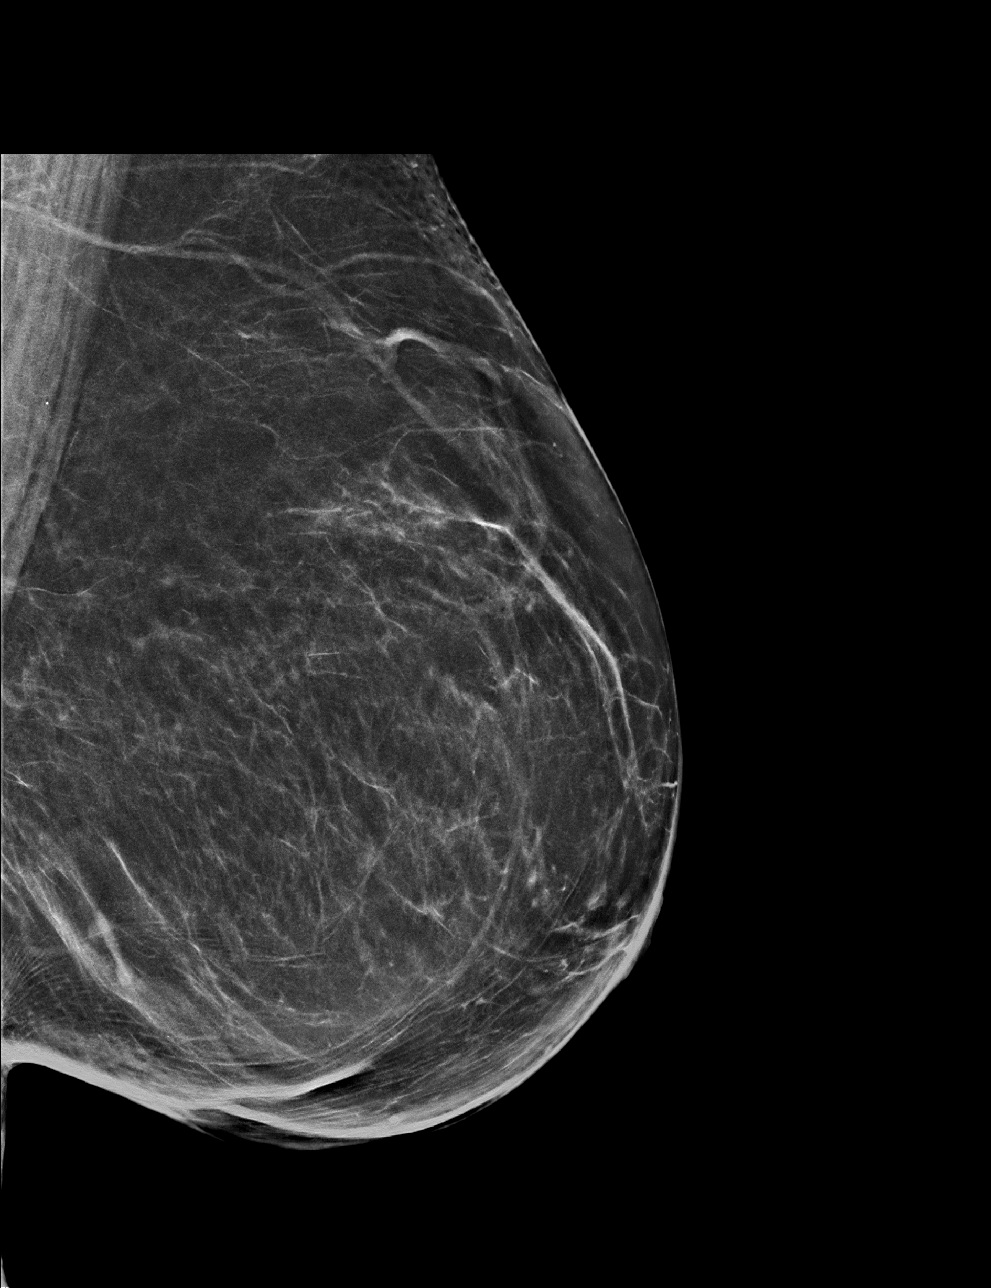

[L CC synth-2D]
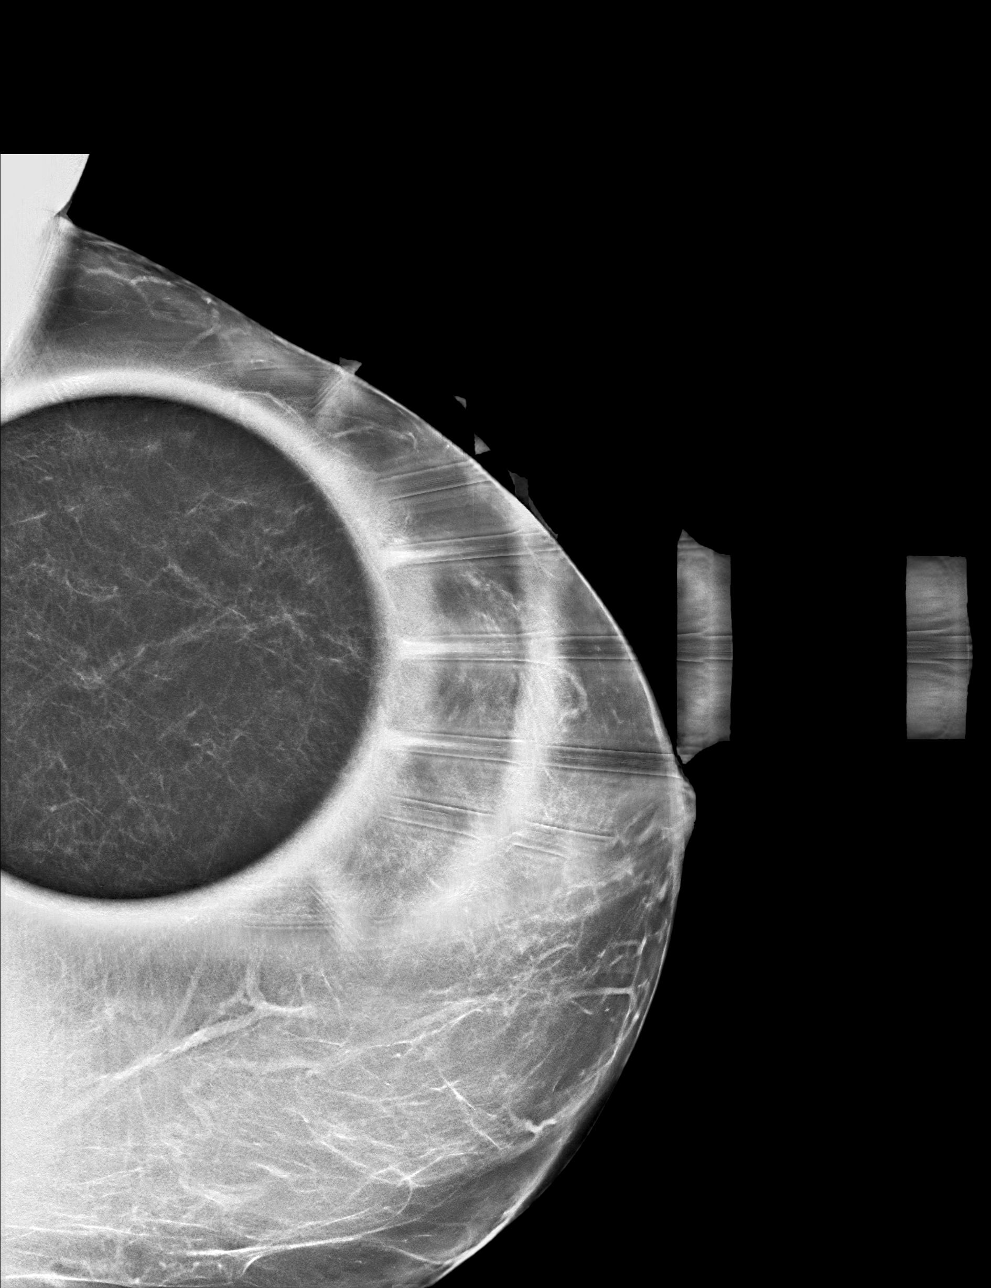

[L ML tomo · tomo slice 35/70.0]
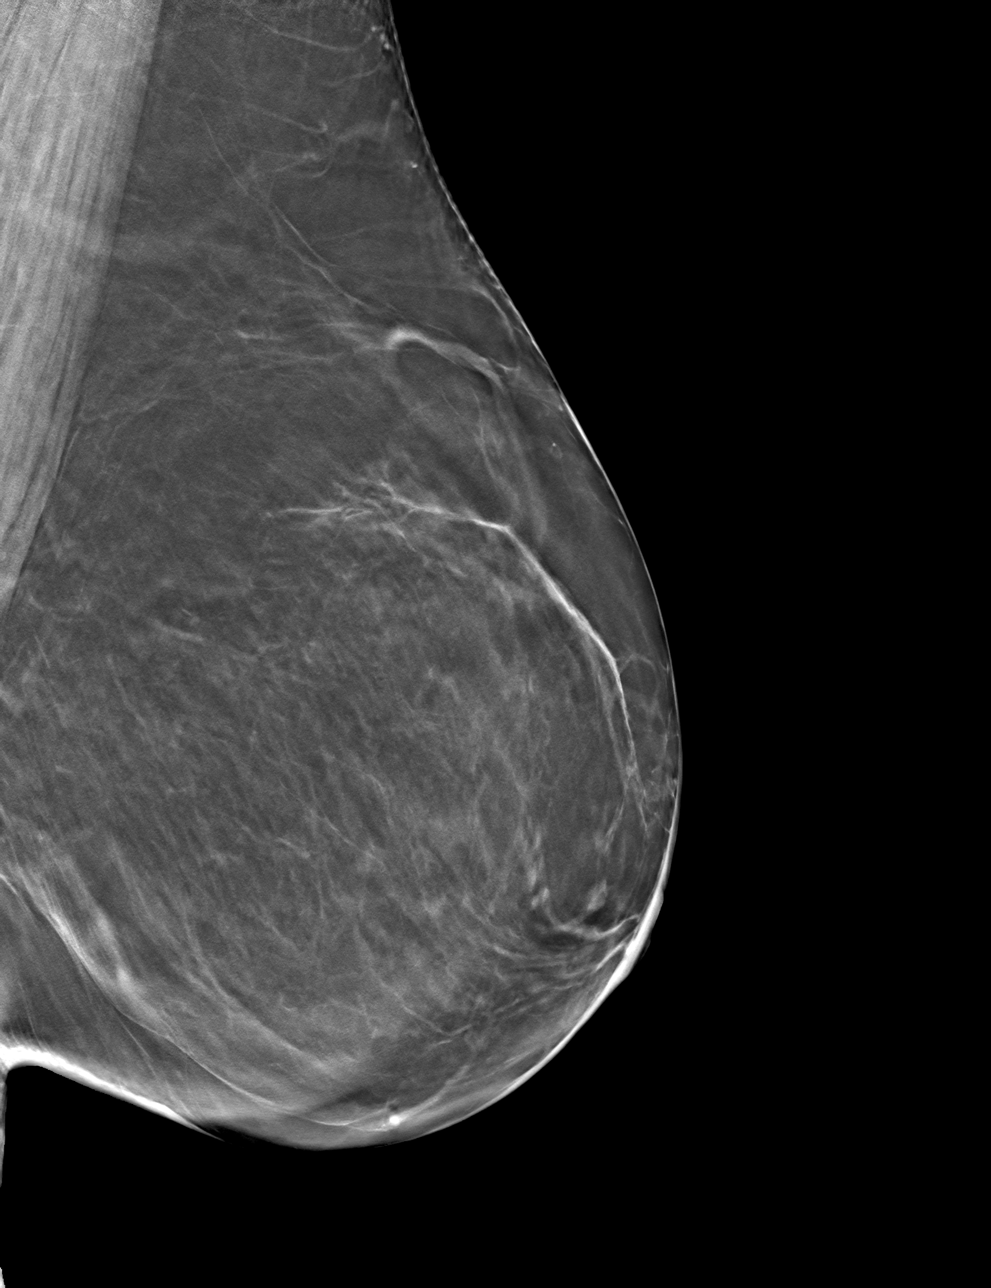

[L CC tomo · tomo slice 29/57.0]
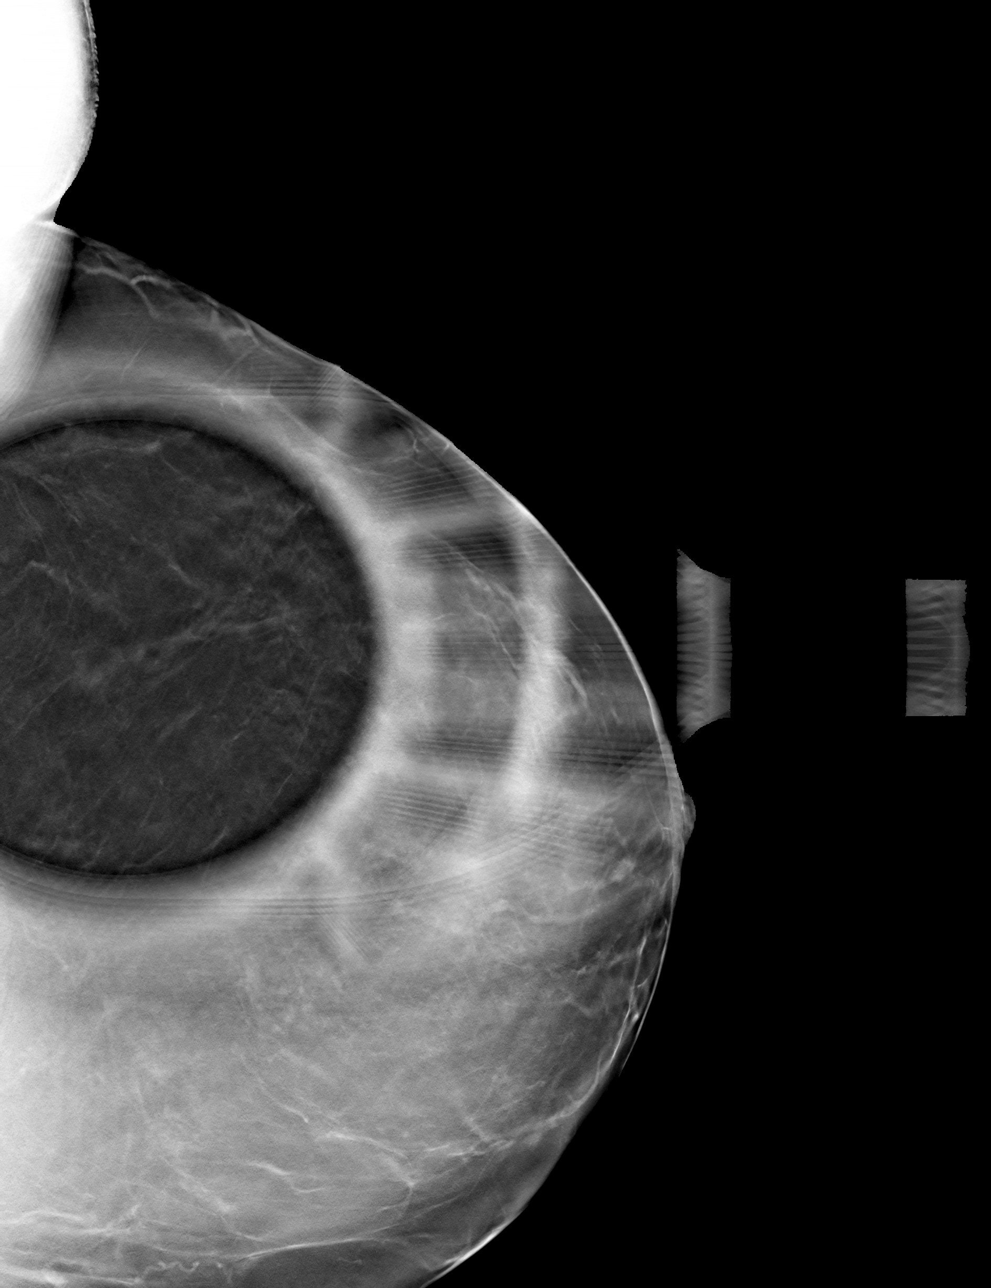

[4 of 12 positions shown; findings below may reference images not displayed]

ACR Breast Density Category b: There are scattered areas of
fibroglandular density.
FINDINGS: 2D/3D full field and spot compression views of the LEFT breast are
performed.

On today's images, the area of the screening study finding has a
similar appearance to remote studies and appears to represent a
tortuous vessel.
IMPRESSION: 1. No persistent suspicious abnormality at the site of the screening
study finding.

RECOMMENDATION:
Bilateral screening mammogram in 1 year.

I have discussed the findings and recommendations with the patient.
If applicable, a reminder letter will be sent to the patient
regarding the next appointment.

BI-RADS CATEGORY  2: Benign.

## 2023-03-05 ENCOUNTER — Other Ambulatory Visit: Payer: Self-pay

## 2023-03-05 ENCOUNTER — Other Ambulatory Visit (HOSPITAL_COMMUNITY): Payer: Self-pay

## 2023-03-05 ENCOUNTER — Other Ambulatory Visit: Payer: Self-pay | Admitting: Adult Health

## 2023-03-05 MED ORDER — CLIMARA PRO 0.045-0.015 MG/DAY TD PTWK
1.0000 | MEDICATED_PATCH | TRANSDERMAL | 6 refills | Status: DC
  Filled 2023-03-05: qty 4, 28d supply, fill #0
  Filled 2023-04-02: qty 4, 28d supply, fill #1
  Filled 2023-06-05: qty 4, 28d supply, fill #2
  Filled 2023-08-21: qty 4, 28d supply, fill #3
  Filled 2023-10-24: qty 4, 28d supply, fill #4
  Filled 2023-11-26: qty 4, 28d supply, fill #5
  Filled 2023-12-24: qty 4, 28d supply, fill #6

## 2023-03-15 ENCOUNTER — Other Ambulatory Visit (HOSPITAL_COMMUNITY): Payer: Self-pay

## 2023-04-02 ENCOUNTER — Other Ambulatory Visit (HOSPITAL_COMMUNITY): Payer: Self-pay

## 2023-04-10 ENCOUNTER — Other Ambulatory Visit (HOSPITAL_COMMUNITY): Payer: Self-pay

## 2023-04-11 ENCOUNTER — Other Ambulatory Visit (HOSPITAL_COMMUNITY): Payer: Self-pay

## 2023-04-17 ENCOUNTER — Telehealth: Payer: Self-pay | Admitting: Internal Medicine

## 2023-04-17 NOTE — Telephone Encounter (Signed)
 Patient need new blood work orders enter before, patient plans to come before her 04.10.2025 to have her labs done.

## 2023-04-18 NOTE — Telephone Encounter (Signed)
 Pt informed labs are in she can walk in for labs.

## 2023-04-19 DIAGNOSIS — E782 Mixed hyperlipidemia: Secondary | ICD-10-CM | POA: Diagnosis not present

## 2023-04-19 DIAGNOSIS — E559 Vitamin D deficiency, unspecified: Secondary | ICD-10-CM | POA: Diagnosis not present

## 2023-04-19 DIAGNOSIS — R7303 Prediabetes: Secondary | ICD-10-CM | POA: Diagnosis not present

## 2023-04-19 DIAGNOSIS — I8393 Asymptomatic varicose veins of bilateral lower extremities: Secondary | ICD-10-CM | POA: Diagnosis not present

## 2023-04-19 DIAGNOSIS — E041 Nontoxic single thyroid nodule: Secondary | ICD-10-CM | POA: Diagnosis not present

## 2023-04-20 LAB — LIPID PANEL
Chol/HDL Ratio: 4.7 ratio — ABNORMAL HIGH (ref 0.0–4.4)
Cholesterol, Total: 183 mg/dL (ref 100–199)
HDL: 39 mg/dL — ABNORMAL LOW (ref >39)
LDL Chol Calc (NIH): 123 mg/dL — ABNORMAL HIGH (ref 0–99)
Triglycerides: 114 mg/dL (ref 0–149)
VLDL Cholesterol Cal: 21 mg/dL (ref 5–40)

## 2023-04-20 LAB — CBC WITH DIFFERENTIAL/PLATELET
Basophils Absolute: 0.1 10*3/uL (ref 0.0–0.2)
Basos: 1 %
EOS (ABSOLUTE): 0.2 10*3/uL (ref 0.0–0.4)
Eos: 3 %
Hematocrit: 44 % (ref 34.0–46.6)
Hemoglobin: 14.1 g/dL (ref 11.1–15.9)
Immature Grans (Abs): 0 10*3/uL (ref 0.0–0.1)
Immature Granulocytes: 0 %
Lymphocytes Absolute: 1.9 10*3/uL (ref 0.7–3.1)
Lymphs: 29 %
MCH: 29.5 pg (ref 26.6–33.0)
MCHC: 32 g/dL (ref 31.5–35.7)
MCV: 92 fL (ref 79–97)
Monocytes Absolute: 0.5 10*3/uL (ref 0.1–0.9)
Monocytes: 8 %
Neutrophils Absolute: 4 10*3/uL (ref 1.4–7.0)
Neutrophils: 59 %
Platelets: 231 10*3/uL (ref 150–450)
RBC: 4.78 x10E6/uL (ref 3.77–5.28)
RDW: 12.5 % (ref 11.7–15.4)
WBC: 6.7 10*3/uL (ref 3.4–10.8)

## 2023-04-20 LAB — CMP14+EGFR
ALT: 11 IU/L (ref 0–32)
AST: 14 IU/L (ref 0–40)
Albumin: 3.7 g/dL — ABNORMAL LOW (ref 3.9–4.9)
Alkaline Phosphatase: 114 IU/L (ref 44–121)
BUN/Creatinine Ratio: 12 (ref 12–28)
BUN: 10 mg/dL (ref 8–27)
Bilirubin Total: 0.8 mg/dL (ref 0.0–1.2)
CO2: 24 mmol/L (ref 20–29)
Calcium: 9.3 mg/dL (ref 8.7–10.3)
Chloride: 102 mmol/L (ref 96–106)
Creatinine, Ser: 0.82 mg/dL (ref 0.57–1.00)
Globulin, Total: 2.7 g/dL (ref 1.5–4.5)
Glucose: 95 mg/dL (ref 70–99)
Potassium: 4.2 mmol/L (ref 3.5–5.2)
Sodium: 139 mmol/L (ref 134–144)
Total Protein: 6.4 g/dL (ref 6.0–8.5)
eGFR: 80 mL/min/{1.73_m2} (ref >59)

## 2023-04-20 LAB — HEMOGLOBIN A1C
Est. average glucose Bld gHb Est-mCnc: 114 mg/dL
Hgb A1c MFr Bld: 5.6 % (ref 4.8–5.6)

## 2023-04-20 LAB — VITAMIN D 25 HYDROXY (VIT D DEFICIENCY, FRACTURES): Vit D, 25-Hydroxy: 49.1 ng/mL (ref 30.0–100.0)

## 2023-04-20 LAB — TSH: TSH: 1.99 u[IU]/mL (ref 0.450–4.500)

## 2023-05-17 ENCOUNTER — Ambulatory Visit: Payer: 59 | Admitting: Internal Medicine

## 2023-06-05 ENCOUNTER — Other Ambulatory Visit (HOSPITAL_COMMUNITY): Payer: Self-pay

## 2023-06-05 ENCOUNTER — Other Ambulatory Visit: Payer: Self-pay | Admitting: Adult Health

## 2023-06-05 MED ORDER — PROGESTERONE 200 MG PO CAPS
200.0000 mg | ORAL_CAPSULE | Freq: Every day | ORAL | 2 refills | Status: AC
  Filled 2023-06-05: qty 90, 90d supply, fill #0
  Filled 2023-09-26: qty 90, 90d supply, fill #1
  Filled 2024-01-14: qty 90, 90d supply, fill #2

## 2023-06-06 ENCOUNTER — Other Ambulatory Visit: Payer: Self-pay

## 2023-06-11 ENCOUNTER — Other Ambulatory Visit: Payer: Self-pay

## 2023-06-27 ENCOUNTER — Encounter: Payer: Self-pay | Admitting: Internal Medicine

## 2023-06-27 ENCOUNTER — Ambulatory Visit (INDEPENDENT_AMBULATORY_CARE_PROVIDER_SITE_OTHER): Admitting: Internal Medicine

## 2023-06-27 ENCOUNTER — Telehealth: Payer: Self-pay

## 2023-06-27 ENCOUNTER — Other Ambulatory Visit: Payer: Self-pay

## 2023-06-27 VITALS — BP 103/71 | HR 85 | Ht 65.5 in | Wt 188.4 lb

## 2023-06-27 DIAGNOSIS — R7303 Prediabetes: Secondary | ICD-10-CM

## 2023-06-27 DIAGNOSIS — W57XXXA Bitten or stung by nonvenomous insect and other nonvenomous arthropods, initial encounter: Secondary | ICD-10-CM | POA: Diagnosis not present

## 2023-06-27 DIAGNOSIS — R399 Unspecified symptoms and signs involving the genitourinary system: Secondary | ICD-10-CM | POA: Diagnosis not present

## 2023-06-27 DIAGNOSIS — E669 Obesity, unspecified: Secondary | ICD-10-CM

## 2023-06-27 DIAGNOSIS — S80861A Insect bite (nonvenomous), right lower leg, initial encounter: Secondary | ICD-10-CM | POA: Diagnosis not present

## 2023-06-27 MED ORDER — DOXYCYCLINE HYCLATE 100 MG PO TABS
100.0000 mg | ORAL_TABLET | Freq: Two times a day (BID) | ORAL | 0 refills | Status: DC
Start: 2023-06-27 — End: 2023-10-01

## 2023-06-27 MED ORDER — WEGOVY 2.4 MG/0.75ML ~~LOC~~ SOAJ
2.4000 mg | SUBCUTANEOUS | 3 refills | Status: DC
  Filled 2023-06-27: qty 3, 28d supply, fill #0
  Filled 2023-08-21: qty 3, 28d supply, fill #1
  Filled 2023-10-24: qty 3, 28d supply, fill #2
  Filled 2023-11-26: qty 3, 28d supply, fill #3

## 2023-06-27 NOTE — Assessment & Plan Note (Signed)
 Lab Results  Component Value Date   HGBA1C 5.6 04/19/2023   Advised to continue low-carb diet On Wegovy  for weight loss benefit

## 2023-06-27 NOTE — Assessment & Plan Note (Signed)
 Urinary frequency and dysuria now resolved UA reviewed, check urine culture Started empiric doxycycline considering recent tick bite Advised to maintain adequate hydration

## 2023-06-27 NOTE — Assessment & Plan Note (Signed)
 Since she has recent onset low-grade fever, fatigue and headache, will start empiric doxycycline

## 2023-06-27 NOTE — Assessment & Plan Note (Addendum)
 BMI Readings from Last 3 Encounters:  06/27/23 30.87 kg/m  11/16/22 31.20 kg/m  05/12/22 32.84 kg/m   Initial BMI - 37.54 when Wegovy  started Diet modification and moderate exercise advised On Wegovy , tolerates it well - has lost 44 lbs Had discussion about alternatives for Wegovy  in the last visit, she agrees to continue Wegovy  for now Checked CMP, HbA1c and lipid profile

## 2023-06-27 NOTE — Telephone Encounter (Signed)
 Copied from CRM [id number]. Topic: General - Other >> Jun 27, 2023  8:04 AM Emylou G wrote: Reason for CRM: Patient called.. wants to know since she was bitten by a tick.Aaron Aaswas wondering is she needs labs before she comes today.. due to her feeling muscle aches and fever from she thinks is tick?

## 2023-06-27 NOTE — Patient Instructions (Signed)
Please start taking Doxycycline as prescribed.  Please continue to take medications as prescribed.  Please continue to follow low carb diet and perform moderate exercise/walking at least 150 mins/week.

## 2023-06-27 NOTE — Progress Notes (Signed)
 Established Patient Office Visit  Subjective:  Patient ID: Amanda Higgins, female    DOB: Feb 19, 1960  Age: 63 y.o. MRN: [id number]  CC:  Chief Complaint  Patient presents with   Medical Management of Chronic Issues    6 month f/u    Tick Removal    Reports a tick bite removed it 2 weeks ago, has been having sx of headache, feeling weak.   Urinary Tract Infection    Has concerns about uti sx.     HPI Amanda Higgins is a 63 y.o. female with past medical history of GAD, GERD and obesity who presents for annual physical.  She has been using Wegovy  2.4 mg qw for weight loss, but has to take it every other week - has to pay out-of-pocket.  She has been tolerating it well.  She has mild nausea, but is manageable currently. She has lost about 44 pounds since 11/22, but she was on Rybelsus  at that time.  She has noticed improvement in her back pain and knee pain.  She has been more active and had started cycling again.  She had EGD in 03/23, which showed erosive esophagitis. She is taking Omeprazole  20 mg QD PRN. Denies dysphagia or odynophagia.  She reports feeling tired and low-grade fever for the last 5 days.  Denies any upper respiratory tract symptoms.  She had urinary frequency and dysuria with mild flank pain in the last week.  Her UA was negative today.  Of note, she also reports having a tick bite about 2 weeks ago (06/10/23), unknown duration of attachment and had to use tweezers to remove it.  She had redness and swelling over the right leg area around the tick bite area, which has improved since then.   Past Medical History:  Diagnosis Date   Anxiety    Phreesia 07/08/2019   Elevated cholesterol with elevated triglycerides 10/01/2015   Encounter for gynecological examination with Papanicolaou smear of cervix 04/17/2018   Encounter for well woman exam with routine gynecological exam 02/02/2017   Hematuria 12/11/2012   Hormone replacement therapy (HRT) 02/02/2017   Hx: UTI  (urinary tract infection)    Left shoulder pain    Nerve impingement   Obesity    Pityriasis rosea 04/17/2018   PMB (postmenopausal bleeding) 09/24/2015   Usually at time of changing patches    Postmenopausal HRT (hormone replacement therapy) 12/17/2012   On combi patch   Screening for colorectal cancer 04/17/2018   Urinary frequency 07/11/2013   Urinary pain 12/11/2012   UTI (urinary tract infection) 11/29/2016   +Ecoli    Past Surgical History:  Procedure Laterality Date   CHOLECYSTECTOMY N/A    Phreesia 07/08/2019   COLONOSCOPY WITH ESOPHAGOGASTRODUODENOSCOPY (EGD) N/A 10/18/2012   Procedure: COLONOSCOPY WITH ESOPHAGOGASTRODUODENOSCOPY (EGD);  Surgeon: Suzette Espy, MD;  Location: [facility];  Service: Endoscopy;  Laterality: N/A;  9:45 AM   COLONOSCOPY WITH PROPOFOL  N/A 05/02/2021   Procedure: COLONOSCOPY WITH PROPOFOL ;  Surgeon: Suzette Espy, MD;  Location: [facility];  Service: Endoscopy;  Laterality: N/A;  9:45 / ASA 2   ESOPHAGOGASTRODUODENOSCOPY (EGD) WITH PROPOFOL  N/A 05/02/2021   Procedure: ESOPHAGOGASTRODUODENOSCOPY (EGD) WITH PROPOFOL ;  Surgeon: Suzette Espy, MD;  Location: [facility];  Service: Endoscopy;  Laterality: N/A;  9:45 / ASA 2   LAPAROSCOPIC CHOLECYSTECTOMY     POLYPECTOMY  05/02/2021   Procedure: POLYPECTOMY;  Surgeon: Suzette Espy, MD;  Location: [facility];  Service: Endoscopy;;  ascending colon    Family History  Problem Relation Age of Onset   Hyperlipidemia Mother    Hypertension Mother    GER disease Mother    Heart disease Father        atrial fib   Hypertension Father    Arthritis Father        RA   Heart disease Paternal Grandmother        CHF   Hyperlipidemia Maternal Grandmother    Kidney disease Brother        kidney stones   Other Sister        precancerous breast lesion   Kidney disease Brother        kidney stones   Colon cancer Neg Hx     Social History   Socioeconomic History   Marital status: Married     Spouse name: Royston Cornea    Number of children: 2   Years of education: 16   Highest education level: Bachelor's degree (e.g., BA, AB, BS)  Occupational History   Not on file  Tobacco Use   Smoking status: Never   Smokeless tobacco: Never  Vaping Use   Vaping status: Never Used  Substance and Sexual Activity   Alcohol use: Yes    Comment: occasional wine   Drug use: No   Sexual activity: Not Currently    Birth control/protection: Post-menopausal  Other Topics Concern   Not on file  Social History Narrative   Lives with husband, Royston Cornea, married 9 years.      Blended family: each had two.   Two daughters   He had a daughter and son      Enjoys traveling: within [place] , [place], [place]       Sunscreen and seat belt wearer   Well-balanced diet   Drinks water - does enjoy diet coke      Does take vitamin D  and C         Social Drivers of Corporate investment banker Strain: Low Risk  (06/23/2023)   Overall Financial Resource Strain (CARDIA)    Difficulty of Paying Living Expenses: Not hard at all  Food Insecurity: No Food Insecurity (06/23/2023)   Hunger Vital Sign    Worried About Running Out of Food in the Last Year: Never true    Ran Out of Food in the Last Year: Never true  Transportation Needs: No Transportation Needs (06/23/2023)   PRAPARE - Administrator, Civil Service (Medical): No    Lack of Transportation (Non-Medical): No  Physical Activity: Insufficiently Active (06/23/2023)   Exercise Vital Sign    Days of Exercise per Week: 3 days    Minutes of Exercise per Session: 30 min  Stress: No Stress Concern Present (06/23/2023)   [employer] of Occupational Health - Occupational Stress Questionnaire    Feeling of Stress : Not at all  Social Connections: Moderately Integrated (06/23/2023)   Social Connection and Isolation Panel [NHANES]    Frequency of Communication with Friends and Family: More than three times a week    Frequency of Social Gatherings  with Friends and Family: Once a week    Attends Religious Services: More than 4 times per year    Active Member of [employer] or Organizations: No    Attends Banker Meetings: Not on file    Marital Status: Married  Intimate Partner Violence: Not At Risk (01/23/2022)   Humiliation, Afraid, Rape, and Kick questionnaire    Fear of Current  or Ex-Partner: No    Emotionally Abused: No    Physically Abused: No    Sexually Abused: No    Outpatient Medications Prior to Visit  Medication Sig Dispense Refill   acetaminophen  (TYLENOL ) 500 MG tablet Take 1,000 mg by mouth every 8 (eight) hours as needed for moderate pain.     Ascorbic Acid (VITAMIN C) 1000 MG tablet Take 1,000 mg by mouth daily.      cholecalciferol (VITAMIN D ) 1000 units tablet Take 1,000 Units by mouth 2 (two) times daily.     estradiol -levonorgestrel  (CLIMARA  PRO) 0.045-0.015 MG/DAY Place 1 patch onto the skin once a week. 4 patch 6   omeprazole  (PRILOSEC) 20 MG capsule Take 1 capsule (20 mg total) by mouth daily. 90 capsule 3   progesterone  (PROMETRIUM ) 200 MG capsule Take 1 capsule (200 mg total) by mouth daily. 90 capsule 2   zinc gluconate 50 MG tablet Take 50 mg by mouth daily.     Semaglutide -Weight Management (WEGOVY ) 2.4 MG/0.75ML SOAJ Inject 2.4 mg into the skin once a week. 3 mL 3   ciprofloxacin  (CIPRO ) 500 MG tablet Take 1 tablet (500 mg total) by mouth 2 (two) times daily. 10 tablet 0   No facility-administered medications prior to visit.    Allergies  Allergen Reactions   Macrobid  [Nitrofurantoin  Monohyd Macro] Hives, Shortness Of Breath and Itching    ROS Review of Systems  Constitutional:  Negative for chills and fever.  HENT:  Negative for congestion, sinus pressure, sinus pain and sore throat.   Eyes:  Negative for pain and discharge.  Respiratory:  Negative for cough and shortness of breath.   Cardiovascular:  Negative for chest pain and palpitations.  Gastrointestinal:  Negative for  abdominal pain, constipation, diarrhea and vomiting.  Endocrine: Negative for polydipsia and polyuria.  Genitourinary:  Negative for dysuria and hematuria.  Musculoskeletal:  Negative for neck pain and neck stiffness.  Skin:  Positive for rash.  Neurological:  Negative for dizziness and weakness.  Psychiatric/Behavioral:  Negative for agitation and behavioral problems.       Objective:    Physical Exam Vitals reviewed.  Constitutional:      General: She is not in acute distress.    Appearance: She is obese. She is not diaphoretic.  HENT:     Head: Normocephalic and atraumatic.     Nose: Nose normal.     Mouth/Throat:     Mouth: Mucous membranes are moist.  Eyes:     General: No scleral icterus.    Extraocular Movements: Extraocular movements intact.  Neck:     Thyroid : No thyroid  tenderness.  Cardiovascular:     Rate and Rhythm: Normal rate and regular rhythm.     Heart sounds: Normal heart sounds. No murmur heard. Pulmonary:     Breath sounds: Normal breath sounds. No wheezing or rales.  Abdominal:     Palpations: Abdomen is soft.     Tenderness: There is no abdominal tenderness.  Musculoskeletal:     Cervical back: Neck supple. No tenderness.     Right lower leg: No edema.     Left lower leg: No edema.  Skin:    General: Skin is warm.     Findings: Rash (Mild erythema around tick bite area over right leg, about 1 cm in diameter) present.     Comments: Varicose veins of b/l LE  Neurological:     General: No focal deficit present.     Mental Status: She is alert  and oriented to person, place, and time.     Sensory: No sensory deficit.     Motor: No weakness.  Psychiatric:        Mood and Affect: Mood normal.        Behavior: Behavior normal.     BP 103/71   Pulse 85   Ht 5' 5.5" (1.664 m)   Wt 188 lb 6.4 oz (85.5 kg)   SpO2 98%   BMI 30.87 kg/m  Wt Readings from Last 3 Encounters:  06/27/23 188 lb 6.4 oz (85.5 kg)  11/16/22 190 lb 6.4 oz (86.4 kg)   05/12/22 200 lb 6.4 oz (90.9 kg)    Lab Results  Component Value Date   TSH 1.990 04/19/2023   Lab Results  Component Value Date   WBC 6.7 04/19/2023   HGB 14.1 04/19/2023   HCT 44.0 04/19/2023   MCV 92 04/19/2023   PLT 231 04/19/2023   Lab Results  Component Value Date   NA 139 04/19/2023   K 4.2 04/19/2023   CO2 24 04/19/2023   GLUCOSE 95 04/19/2023   BUN 10 04/19/2023   CREATININE 0.82 04/19/2023   BILITOT 0.8 04/19/2023   ALKPHOS 114 04/19/2023   AST 14 04/19/2023   ALT 11 04/19/2023   PROT 6.4 04/19/2023   ALBUMIN 3.7 (L) 04/19/2023   CALCIUM  9.3 04/19/2023   ANIONGAP 10 08/27/2019   EGFR 80 04/19/2023   Lab Results  Component Value Date   CHOL 183 04/19/2023   Lab Results  Component Value Date   HDL 39 (L) 04/19/2023   Lab Results  Component Value Date   LDLCALC 123 (H) 04/19/2023   Lab Results  Component Value Date   TRIG 114 04/19/2023   Lab Results  Component Value Date   CHOLHDL 4.7 (H) 04/19/2023   Lab Results  Component Value Date   HGBA1C 5.6 04/19/2023      Assessment & Plan:   Problem List Items Addressed This Visit       Musculoskeletal and Integument   Tick bite of right lower leg   Since she has recent onset low-grade fever, fatigue and headache, will start empiric doxycycline      Relevant Medications   doxycycline (VIBRA-TABS) 100 MG tablet     Other   Prediabetes   Lab Results  Component Value Date   HGBA1C 5.6 04/19/2023   Advised to continue low-carb diet On Wegovy  for weight loss benefit      Obesity (BMI 30-39.9) - Primary   BMI Readings from Last 3 Encounters:  06/27/23 30.87 kg/m  11/16/22 31.20 kg/m  05/12/22 32.84 kg/m   Initial BMI - 37.54 when Wegovy  started Diet modification and moderate exercise advised On Wegovy , tolerates it well - has lost 44 lbs Had discussion about alternatives for Wegovy  in the last visit, she agrees to continue Wegovy  for now Checked CMP, HbA1c and lipid profile       Relevant Medications   Semaglutide -Weight Management (WEGOVY ) 2.4 MG/0.75ML SOAJ   UTI symptoms   Urinary frequency and dysuria now resolved UA reviewed, check urine culture Started empiric doxycycline considering recent tick bite Advised to maintain adequate hydration      Relevant Orders   Urinalysis   Urine Culture    Meds ordered this encounter  Medications   doxycycline (VIBRA-TABS) 100 MG tablet    Sig: Take 1 tablet (100 mg total) by mouth 2 (two) times daily.    Dispense:  14 tablet  Refill:  0   Semaglutide -Weight Management (WEGOVY ) 2.4 MG/0.75ML SOAJ    Sig: Inject 2.4 mg into the skin once a week.    Dispense:  3 mL    Refill:  3    Follow-up: Return in about 6 months (around 12/28/2023).    Meldon Sport, MD

## 2023-06-28 ENCOUNTER — Other Ambulatory Visit: Payer: Self-pay

## 2023-06-29 ENCOUNTER — Ambulatory Visit: Payer: Self-pay | Admitting: Internal Medicine

## 2023-06-29 LAB — URINE CULTURE

## 2023-06-29 LAB — URINALYSIS
Bilirubin, UA: NEGATIVE
Glucose, UA: NEGATIVE
Ketones, UA: NEGATIVE
Leukocytes,UA: NEGATIVE
Nitrite, UA: NEGATIVE
Protein,UA: NEGATIVE
RBC, UA: NEGATIVE
Specific Gravity, UA: 1.02 (ref 1.005–1.030)
Urobilinogen, Ur: 1 mg/dL (ref 0.2–1.0)
pH, UA: 6 (ref 5.0–7.5)

## 2023-07-03 ENCOUNTER — Other Ambulatory Visit: Payer: Self-pay

## 2023-07-05 ENCOUNTER — Other Ambulatory Visit: Payer: Self-pay

## 2023-07-09 ENCOUNTER — Encounter (HOSPITAL_COMMUNITY): Payer: Self-pay

## 2023-07-11 ENCOUNTER — Encounter (HOSPITAL_COMMUNITY): Payer: Self-pay

## 2023-07-11 ENCOUNTER — Inpatient Hospital Stay (HOSPITAL_COMMUNITY): Admission: RE | Admit: 2023-07-11 | Source: Ambulatory Visit

## 2023-07-11 DIAGNOSIS — Z1231 Encounter for screening mammogram for malignant neoplasm of breast: Secondary | ICD-10-CM

## 2023-07-13 ENCOUNTER — Other Ambulatory Visit (HOSPITAL_COMMUNITY): Payer: Self-pay | Admitting: Internal Medicine

## 2023-07-13 DIAGNOSIS — Z1231 Encounter for screening mammogram for malignant neoplasm of breast: Secondary | ICD-10-CM

## 2023-07-30 ENCOUNTER — Ambulatory Visit (HOSPITAL_COMMUNITY)
Admission: RE | Admit: 2023-07-30 | Discharge: 2023-07-30 | Disposition: A | Discharge status: Home / Self Care | Source: Ambulatory Visit | Attending: Internal Medicine | Admitting: Internal Medicine

## 2023-07-30 ENCOUNTER — Encounter (HOSPITAL_COMMUNITY): Payer: Self-pay

## 2023-07-30 DIAGNOSIS — Z1231 Encounter for screening mammogram for malignant neoplasm of breast: Secondary | ICD-10-CM | POA: Diagnosis not present

## 2023-08-01 ENCOUNTER — Ambulatory Visit: Payer: Self-pay | Admitting: Adult Health

## 2023-08-21 ENCOUNTER — Other Ambulatory Visit (HOSPITAL_COMMUNITY): Payer: Self-pay

## 2023-08-22 ENCOUNTER — Other Ambulatory Visit: Payer: Self-pay

## 2023-08-27 ENCOUNTER — Other Ambulatory Visit: Payer: Self-pay

## 2023-09-26 ENCOUNTER — Other Ambulatory Visit (HOSPITAL_COMMUNITY): Payer: Self-pay

## 2023-10-01 ENCOUNTER — Telehealth: Payer: Self-pay | Admitting: Adult Health

## 2023-10-01 MED ORDER — SULFAMETHOXAZOLE-TRIMETHOPRIM 800-160 MG PO TABS: 1.0000 | ORAL_TABLET | Freq: Two times a day (BID) | ORAL | 0 refills | Status: DC

## 2023-10-01 NOTE — Telephone Encounter (Signed)
 Feels like has UTI, Rx septra  ds

## 2023-10-24 ENCOUNTER — Other Ambulatory Visit: Payer: Self-pay

## 2023-10-24 ENCOUNTER — Other Ambulatory Visit (HOSPITAL_COMMUNITY): Payer: Self-pay

## 2023-10-24 ENCOUNTER — Other Ambulatory Visit: Payer: Self-pay | Admitting: Internal Medicine

## 2023-10-24 MED ORDER — OMEPRAZOLE 20 MG PO CPDR
20.0000 mg | DELAYED_RELEASE_CAPSULE | Freq: Every day | ORAL | 3 refills | Status: AC
  Filled 2023-10-24: qty 90, 90d supply, fill #0
  Filled 2024-01-18: qty 90, 90d supply, fill #1

## 2023-10-31 DIAGNOSIS — R300 Dysuria: Secondary | ICD-10-CM | POA: Diagnosis not present

## 2023-11-26 ENCOUNTER — Other Ambulatory Visit: Payer: Self-pay

## 2023-11-26 ENCOUNTER — Other Ambulatory Visit (HOSPITAL_COMMUNITY): Payer: Self-pay

## 2023-11-27 ENCOUNTER — Other Ambulatory Visit: Payer: Self-pay

## 2023-12-20 ENCOUNTER — Other Ambulatory Visit (HOSPITAL_COMMUNITY): Payer: Self-pay

## 2023-12-24 ENCOUNTER — Other Ambulatory Visit: Payer: Self-pay | Admitting: Internal Medicine

## 2023-12-24 ENCOUNTER — Other Ambulatory Visit: Payer: Self-pay

## 2023-12-24 ENCOUNTER — Other Ambulatory Visit (HOSPITAL_COMMUNITY): Payer: Self-pay

## 2023-12-24 DIAGNOSIS — E669 Obesity, unspecified: Secondary | ICD-10-CM

## 2023-12-24 MED ORDER — WEGOVY 2.4 MG/0.75ML ~~LOC~~ SOAJ
2.4000 mg | SUBCUTANEOUS | 3 refills | Status: DC
  Filled 2023-12-24: qty 3, 28d supply, fill #0

## 2023-12-25 ENCOUNTER — Other Ambulatory Visit: Payer: Self-pay

## 2023-12-28 ENCOUNTER — Ambulatory Visit: Admitting: Internal Medicine

## 2024-01-14 ENCOUNTER — Other Ambulatory Visit (HOSPITAL_COMMUNITY): Payer: Self-pay

## 2024-01-14 ENCOUNTER — Other Ambulatory Visit (HOSPITAL_BASED_OUTPATIENT_CLINIC_OR_DEPARTMENT_OTHER): Payer: Self-pay

## 2024-01-15 ENCOUNTER — Other Ambulatory Visit: Payer: Self-pay

## 2024-01-15 ENCOUNTER — Other Ambulatory Visit: Payer: Self-pay | Admitting: Adult Health

## 2024-01-15 ENCOUNTER — Other Ambulatory Visit (HOSPITAL_COMMUNITY): Payer: Self-pay

## 2024-01-15 MED ORDER — CLIMARA PRO 0.045-0.015 MG/DAY TD PTWK
1.0000 | MEDICATED_PATCH | TRANSDERMAL | 6 refills | Status: AC
  Filled 2024-01-15: qty 4, 28d supply, fill #0

## 2024-01-18 ENCOUNTER — Other Ambulatory Visit: Payer: Self-pay

## 2024-01-18 ENCOUNTER — Ambulatory Visit: Admitting: Internal Medicine

## 2024-01-18 ENCOUNTER — Encounter: Payer: Self-pay | Admitting: Internal Medicine

## 2024-01-18 VITALS — BP 108/72 | HR 87 | Ht 65.5 in | Wt 188.2 lb

## 2024-01-18 DIAGNOSIS — E782 Mixed hyperlipidemia: Secondary | ICD-10-CM | POA: Diagnosis not present

## 2024-01-18 DIAGNOSIS — E669 Obesity, unspecified: Secondary | ICD-10-CM

## 2024-01-18 DIAGNOSIS — Z23 Encounter for immunization: Secondary | ICD-10-CM | POA: Diagnosis not present

## 2024-01-18 DIAGNOSIS — E041 Nontoxic single thyroid nodule: Secondary | ICD-10-CM | POA: Diagnosis not present

## 2024-01-18 DIAGNOSIS — Z0001 Encounter for general adult medical examination with abnormal findings: Secondary | ICD-10-CM

## 2024-01-18 DIAGNOSIS — R7303 Prediabetes: Secondary | ICD-10-CM | POA: Diagnosis not present

## 2024-01-18 MED ORDER — TIRZEPATIDE-WEIGHT MANAGEMENT 10 MG/0.5ML ~~LOC~~ SOLN: 10.0000 mg | SUBCUTANEOUS | 2 refills | Status: AC

## 2024-01-18 NOTE — Patient Instructions (Signed)
 Please start taking Zepbound 5 mg once weekly as discussed after you complete current supply of Wegovy .  Please continue to take medications as prescribed.  Please continue to follow low carb diet and perform moderate exercise/walking at least 150 mins/week.

## 2024-01-18 NOTE — Assessment & Plan Note (Signed)
 BMI Readings from Last 3 Encounters:  01/18/24 30.84 kg/m  06/27/23 30.87 kg/m  11/16/22 31.20 kg/m   Initial BMI - 37.54 when Wegovy  started Diet modification and moderate exercise advised On Wegovy , tolerates it well - has lost 44 lbs Had discussion about alternatives for Wegovy  in the last visit, she agrees to switch to Zepbound for now - sent Zepbound prescription to [facility] pharmacy - patient is advised to take 5 mg dose from vial, she expressed understanding Checked CMP, HbA1c and lipid profile

## 2024-01-18 NOTE — Assessment & Plan Note (Signed)
 Physical exam as documented. Fasting blood tests reviewed from previous visit. Shingrix vaccine #1 today. Wants to think about pneumococcal vaccine.

## 2024-01-18 NOTE — Progress Notes (Signed)
 Established Patient Office Visit  Subjective:  Patient ID: Amanda Higgins, female    DOB: 1961-01-25  Age: 63 y.o. MRN: [id number]  CC:  Chief Complaint  Patient presents with   Obesity    Follow up    HPI Amanda Higgins is a 63 y.o. female with past medical history of GAD, GERD and obesity who presents for annual physical.  She has been using Wegovy  2.4 mg for weight loss, but has to take it every other week - has to pay out-of-pocket.  She has been tolerating it well.  She has mild nausea, but is manageable currently. She has lost about 44 pounds since 11/22, but her weight has plateaued since the last visit.  She has noticed improvement in her back pain and knee pain.  She has been more active and had started cycling again.  She had EGD in 03/23, which showed erosive esophagitis. She is taking Omeprazole  20 mg QD PRN. Denies dysphagia or odynophagia.    Past Medical History:  Diagnosis Date   Anxiety    Phreesia 07/08/2019   Elevated cholesterol with elevated triglycerides 10/01/2015   Encounter for gynecological examination with Papanicolaou smear of cervix 04/17/2018   Encounter for well woman exam with routine gynecological exam 02/02/2017   Hematuria 12/11/2012   Hormone replacement therapy (HRT) 02/02/2017   Hx: UTI (urinary tract infection)    Left shoulder pain    Nerve impingement   Obesity    Pityriasis rosea 04/17/2018   PMB (postmenopausal bleeding) 09/24/2015   Usually at time of changing patches    Postmenopausal HRT (hormone replacement therapy) 12/17/2012   On combi patch   Screening for colorectal cancer 04/17/2018   Urinary frequency 07/11/2013   Urinary pain 12/11/2012   UTI (urinary tract infection) 11/29/2016   +Ecoli    Past Surgical History:  Procedure Laterality Date   CHOLECYSTECTOMY N/A    Phreesia 07/08/2019   COLONOSCOPY WITH ESOPHAGOGASTRODUODENOSCOPY (EGD) N/A 10/18/2012   Procedure: COLONOSCOPY WITH ESOPHAGOGASTRODUODENOSCOPY (EGD);   Surgeon: Lamar CHRISTELLA Hollingshead, MD;  Location: [facility];  Service: Endoscopy;  Laterality: N/A;  9:45 AM   COLONOSCOPY WITH PROPOFOL  N/A 05/02/2021   Procedure: COLONOSCOPY WITH PROPOFOL ;  Surgeon: Hollingshead Lamar CHRISTELLA, MD;  Location: [facility];  Service: Endoscopy;  Laterality: N/A;  9:45 / ASA 2   ESOPHAGOGASTRODUODENOSCOPY (EGD) WITH PROPOFOL  N/A 05/02/2021   Procedure: ESOPHAGOGASTRODUODENOSCOPY (EGD) WITH PROPOFOL ;  Surgeon: Hollingshead Lamar CHRISTELLA, MD;  Location: [facility];  Service: Endoscopy;  Laterality: N/A;  9:45 / ASA 2   LAPAROSCOPIC CHOLECYSTECTOMY     POLYPECTOMY  05/02/2021   Procedure: POLYPECTOMY;  Surgeon: Hollingshead Lamar CHRISTELLA, MD;  Location: [facility];  Service: Endoscopy;;  ascending colon    Family History  Problem Relation Age of Onset   Hyperlipidemia Mother    Hypertension Mother    GER disease Mother    Heart disease Father        atrial fib   Hypertension Father    Arthritis Father        RA   Heart disease Paternal Grandmother        CHF   Hyperlipidemia Maternal Grandmother    Kidney disease Brother        kidney stones   Other Sister        precancerous breast lesion   Kidney disease Brother        kidney stones   Colon cancer Neg Hx  Social History   Socioeconomic History   Marital status: Married    Spouse name: Lynwood    Number of children: 2   Years of education: 16   Highest education level: Bachelor's degree (e.g., BA, AB, BS)  Occupational History   Not on file  Tobacco Use   Smoking status: Never   Smokeless tobacco: Never  Vaping Use   Vaping status: Never Used  Substance and Sexual Activity   Alcohol use: Yes    Comment: occasional wine   Drug use: No   Sexual activity: Not Currently    Birth control/protection: Post-menopausal  Other Topics Concern   Not on file  Social History Narrative   Lives with husband, Lynwood, married 9 years.      Blended family: each had two.   Two daughters   He had a daughter and son      Enjoys  traveling: within [place] , [place], [place]       Sunscreen and seat belt wearer   Well-balanced diet   Drinks water - does enjoy diet coke      Does take vitamin D  and C         Social Drivers of Health   Tobacco Use: Low Risk (01/18/2024)   Patient History    Smoking Tobacco Use: Never    Smokeless Tobacco Use: Never    Passive Exposure: Not on file  Financial Resource Strain: Low Risk (01/14/2024)   Overall Financial Resource Strain (CARDIA)    Difficulty of Paying Living Expenses: Not hard at all  Food Insecurity: No Food Insecurity (01/14/2024)   Epic    Worried About Radiation Protection Practitioner of Food in the Last Year: Never true    Ran Out of Food in the Last Year: Never true  Transportation Needs: No Transportation Needs (01/14/2024)   Epic    Lack of Transportation (Medical): No    Lack of Transportation (Non-Medical): No  Physical Activity: Insufficiently Active (01/14/2024)   Exercise Vital Sign    Days of Exercise per Week: 4 days    Minutes of Exercise per Session: 30 min  Stress: No Stress Concern Present (01/14/2024)   [employer] of Occupational Health - Occupational Stress Questionnaire    Feeling of Stress: Not at all  Social Connections: Moderately Integrated (01/14/2024)   Social Connection and Isolation Panel    Frequency of Communication with Friends and Family: More than three times a week    Frequency of Social Gatherings with Friends and Family: Three times a week    Attends Religious Services: 1 to 4 times per year    Active Member of Clubs or Organizations: No    Attends Banker Meetings: Not on file    Marital Status: Married  Catering Manager Violence: Not At Risk (01/23/2022)   Humiliation, Afraid, Rape, and Kick questionnaire    Fear of Current or Ex-Partner: No    Emotionally Abused: No    Physically Abused: No    Sexually Abused: No  Depression (PHQ2-9): Low Risk (01/18/2024)   Depression (PHQ2-9)    PHQ-2 Score: 0  Alcohol Screen:  Low Risk (06/23/2023)   Alcohol Screen    Last Alcohol Screening Score (AUDIT): 1  Housing: Unknown (01/14/2024)   Epic    Unable to Pay for Housing in the Last Year: No    Number of Times Moved in the Last Year: Not on file    Homeless in the Last Year: No  Utilities: At Risk (01/23/2022)  [facility] Utilities    Threatened with loss of utilities: Yes  Health Literacy: Not on file    Outpatient Medications Prior to Visit  Medication Sig Dispense Refill   acetaminophen  (TYLENOL ) 500 MG tablet Take 1,000 mg by mouth every 8 (eight) hours as needed for moderate pain.     Ascorbic Acid (VITAMIN C) 1000 MG tablet Take 1,000 mg by mouth daily.      cholecalciferol (VITAMIN D ) 1000 units tablet Take 1,000 Units by mouth 2 (two) times daily.     estradiol -levonorgestrel  (CLIMARA  PRO) 0.045-0.015 MG/DAY Place 1 patch onto the skin once a week. Tell pt to make appointment 4 patch 6   omeprazole  (PRILOSEC) 20 MG capsule Take 1 capsule (20 mg total) by mouth daily. 90 capsule 3   progesterone  (PROMETRIUM ) 200 MG capsule Take 1 capsule (200 mg total) by mouth daily. 90 capsule 2   zinc gluconate 50 MG tablet Take 50 mg by mouth daily.     semaglutide -weight management (WEGOVY ) 2.4 MG/0.75ML SOAJ SQ injection Inject 2.4 mg into the skin once a week. 3 mL 3   sulfamethoxazole -trimethoprim  (BACTRIM  DS) 800-160 MG tablet Take 1 tablet by mouth 2 (two) times daily. Take 1 bid 14 tablet 0   No facility-administered medications prior to visit.    Allergies  Allergen Reactions   Macrobid  [Nitrofurantoin  Monohyd Macro] Hives, Shortness Of Breath and Itching    ROS Review of Systems  Constitutional:  Negative for chills and fever.  HENT:  Negative for congestion, sinus pressure, sinus pain and sore throat.   Eyes:  Negative for pain and discharge.  Respiratory:  Negative for cough and shortness of breath.   Cardiovascular:  Negative for chest pain and palpitations.  Gastrointestinal:  Negative for  abdominal pain, constipation, diarrhea and vomiting.  Endocrine: Negative for polydipsia and polyuria.  Genitourinary:  Negative for dysuria and hematuria.  Musculoskeletal:  Negative for neck pain and neck stiffness.  Skin:  Negative for rash.  Neurological:  Negative for dizziness and weakness.  Psychiatric/Behavioral:  Negative for agitation and behavioral problems.       Objective:    Physical Exam Vitals reviewed.  Constitutional:      General: She is not in acute distress.    Appearance: She is obese. She is not diaphoretic.  HENT:     Head: Normocephalic and atraumatic.     Nose: Nose normal.     Mouth/Throat:     Mouth: Mucous membranes are moist.  Eyes:     General: No scleral icterus.    Extraocular Movements: Extraocular movements intact.  Neck:     Thyroid : No thyroid  tenderness.  Cardiovascular:     Rate and Rhythm: Normal rate and regular rhythm.     Heart sounds: Normal heart sounds. No murmur heard. Pulmonary:     Breath sounds: Normal breath sounds. No wheezing or rales.  Abdominal:     Palpations: Abdomen is soft.     Tenderness: There is no abdominal tenderness.  Musculoskeletal:     Cervical back: Neck supple. No tenderness.     Right lower leg: No edema.     Left lower leg: No edema.  Skin:    General: Skin is warm.     Findings: No rash.     Comments: Varicose veins of b/l LE  Neurological:     General: No focal deficit present.     Mental Status: She is alert and oriented to person, place, and time.  Sensory: No sensory deficit.     Motor: No weakness.  Psychiatric:        Mood and Affect: Mood normal.        Behavior: Behavior normal.     BP 108/72   Pulse 87   Ht 5' 5.5 (1.664 m)   Wt 188 lb 3.2 oz (85.4 kg)   SpO2 98%   BMI 30.84 kg/m  Wt Readings from Last 3 Encounters:  01/18/24 188 lb 3.2 oz (85.4 kg)  06/27/23 188 lb 6.4 oz (85.5 kg)  11/16/22 190 lb 6.4 oz (86.4 kg)    Lab Results  Component Value Date   TSH 1.990  04/19/2023   Lab Results  Component Value Date   WBC 6.7 04/19/2023   HGB 14.1 04/19/2023   HCT 44.0 04/19/2023   MCV 92 04/19/2023   PLT 231 04/19/2023   Lab Results  Component Value Date   NA 139 04/19/2023   K 4.2 04/19/2023   CO2 24 04/19/2023   GLUCOSE 95 04/19/2023   BUN 10 04/19/2023   CREATININE 0.82 04/19/2023   BILITOT 0.8 04/19/2023   ALKPHOS 114 04/19/2023   AST 14 04/19/2023   ALT 11 04/19/2023   PROT 6.4 04/19/2023   ALBUMIN 3.7 (L) 04/19/2023   CALCIUM  9.3 04/19/2023   ANIONGAP 10 08/27/2019   EGFR 80 04/19/2023   Lab Results  Component Value Date   CHOL 183 04/19/2023   Lab Results  Component Value Date   HDL 39 (L) 04/19/2023   Lab Results  Component Value Date   LDLCALC 123 (H) 04/19/2023   Lab Results  Component Value Date   TRIG 114 04/19/2023   Lab Results  Component Value Date   CHOLHDL 4.7 (H) 04/19/2023   Lab Results  Component Value Date   HGBA1C 5.6 04/19/2023      Assessment & Plan:   Problem List Items Addressed This Visit       Endocrine   Thyroid  nodule   Checked US  thyroid  in 2022 - has 2 small nodules - 1.1 cm and 0.8 cm, benign TSH has remained WNL        Other   HLD (hyperlipidemia)   Diet modification for now Checked lipid profile      Encounter for general adult medical examination with abnormal findings   Physical exam as documented. Fasting blood tests reviewed from previous visit. Shingrix vaccine #1 today. Wants to think about pneumococcal vaccine.      Prediabetes   Lab Results  Component Value Date   HGBA1C 5.6 04/19/2023   Advised to continue low-carb diet On Wegovy  for weight loss benefit, plan to switch to Zepbound      Obesity (BMI 30-39.9) - Primary   BMI Readings from Last 3 Encounters:  01/18/24 30.84 kg/m  06/27/23 30.87 kg/m  11/16/22 31.20 kg/m   Initial BMI - 37.54 when Wegovy  started Diet modification and moderate exercise advised On Wegovy , tolerates it well - has  lost 44 lbs Had discussion about alternatives for Wegovy  in the last visit, she agrees to switch to Zepbound for now - sent Zepbound prescription to [facility] pharmacy - patient is advised to take 5 mg dose from vial, she expressed understanding Checked CMP, HbA1c and lipid profile      Relevant Medications   tirzepatide 10 MG/0.5ML injection vial   Other Relevant Orders   CMP14+EGFR   Other Visit Diagnoses       Encounter for immunization  Relevant Orders   Varicella-zoster vaccine IM (Completed)       Meds ordered this encounter  Medications   tirzepatide 10 MG/0.5ML injection vial    Sig: Inject 10 mg into the skin once a week.    Dispense:  2 mL    Refill:  2    Follow-up: Return in about 6 months (around 07/18/2024).    Suzzane MARLA Blanch, MD

## 2024-01-18 NOTE — Assessment & Plan Note (Addendum)
 Diet modification for now Checked lipid profile

## 2024-01-18 NOTE — Assessment & Plan Note (Signed)
 Checked US  thyroid  in 2022 - has 2 small nodules - 1.1 cm and 0.8 cm, benign TSH has remained WNL

## 2024-01-18 NOTE — Assessment & Plan Note (Signed)
 Lab Results  Component Value Date   HGBA1C 5.6 04/19/2023   Advised to continue low-carb diet On Wegovy  for weight loss benefit, plan to switch to Zepbound

## 2024-01-19 LAB — CMP14+EGFR
ALT: 11 IU/L (ref 0–32)
AST: 15 IU/L (ref 0–40)
Albumin: 4.2 g/dL (ref 3.9–4.9)
Alkaline Phosphatase: 106 IU/L (ref 49–135)
BUN/Creatinine Ratio: 11 — ABNORMAL LOW (ref 12–28)
BUN: 9 mg/dL (ref 8–27)
Bilirubin Total: 0.6 mg/dL (ref 0.0–1.2)
CO2: 22 mmol/L (ref 20–29)
Calcium: 9.3 mg/dL (ref 8.7–10.3)
Chloride: 103 mmol/L (ref 96–106)
Creatinine, Ser: 0.85 mg/dL (ref 0.57–1.00)
Globulin, Total: 2.7 g/dL (ref 1.5–4.5)
Glucose: 94 mg/dL (ref 70–99)
Potassium: 4.3 mmol/L (ref 3.5–5.2)
Sodium: 142 mmol/L (ref 134–144)
Total Protein: 6.9 g/dL (ref 6.0–8.5)
eGFR: 76 mL/min/1.73 (ref >59)

## 2024-01-21 ENCOUNTER — Ambulatory Visit: Payer: Self-pay | Admitting: Internal Medicine

## 2024-02-19 ENCOUNTER — Telehealth: Admitting: Family Medicine

## 2024-02-19 ENCOUNTER — Other Ambulatory Visit (HOSPITAL_COMMUNITY): Payer: Self-pay

## 2024-02-19 DIAGNOSIS — B9689 Other specified bacterial agents as the cause of diseases classified elsewhere: Secondary | ICD-10-CM

## 2024-02-19 DIAGNOSIS — J069 Acute upper respiratory infection, unspecified: Secondary | ICD-10-CM

## 2024-02-19 MED ORDER — AMOXICILLIN-POT CLAVULANATE 875-125 MG PO TABS: 1.0000 | ORAL_TABLET | Freq: Two times a day (BID) | ORAL | 0 refills | Status: AC

## 2024-02-19 MED ORDER — FLUTICASONE PROPIONATE 50 MCG/ACT NA SUSP
2.0000 | Freq: Every day | NASAL | 0 refills | Status: DC
  Filled 2024-02-19: qty 16, fill #0

## 2024-02-19 MED ORDER — AMOXICILLIN-POT CLAVULANATE 875-125 MG PO TABS
1.0000 | ORAL_TABLET | Freq: Two times a day (BID) | ORAL | 0 refills | Status: DC
  Filled 2024-02-19: qty 14, 7d supply, fill #0

## 2024-02-19 MED ORDER — BENZONATATE 100 MG PO CAPS
100.0000 mg | ORAL_CAPSULE | Freq: Three times a day (TID) | ORAL | 0 refills | Status: DC | PRN
  Filled 2024-02-19: qty 30, 5d supply, fill #0

## 2024-02-19 MED ORDER — BENZONATATE 100 MG PO CAPS: 100.0000 mg | ORAL_CAPSULE | Freq: Three times a day (TID) | ORAL | 0 refills | Status: AC | PRN

## 2024-02-19 MED ORDER — FLUTICASONE PROPIONATE 50 MCG/ACT NA SUSP: 2.0000 | Freq: Every day | NASAL | 0 refills | Status: AC

## 2024-02-19 NOTE — Progress Notes (Signed)
"      E-Visit for Sinus Problems  We are sorry that you are not feeling well.  Here is how we plan to help!  Based on what you have shared with me it looks like you have sinusitis.  Sinusitis is inflammation and infection in the sinus cavities of the head.  Based on your presentation I believe you most likely have Acute Bacterial Sinusitis.  This is an infection caused by bacteria and is treated with antibiotics. I have prescribed Augmentin  875mg /125mg  one tablet twice daily with food, for 7 days. and I have also prescribed Flonase  Nasal Spray Use 2 sprays in each nostril daily for 10-14 days, and Tessalon  perles 100mg  Take 1-2 capsules every 8 hours as needed for cough.  You may use an oral decongestant such as Mucinex D or if you have glaucoma or high blood pressure use plain Mucinex. Saline nasal spray help and can safely be used as often as needed for congestion.  If you develop worsening sinus pain, fever or notice severe headache and vision changes, or if symptoms are not better after completion of antibiotic, please schedule an appointment with a health care provider.    Sinus infections are not as easily transmitted as other respiratory infection, however we still recommend that you avoid close contact with loved ones, especially the very young and elderly.  Remember to wash your hands thoroughly throughout the day as this is the number one way to prevent the spread of infection!  Home Care: Only take medications as instructed by your medical team. Complete the entire course of an antibiotic. Do not take these medications with alcohol. A steam or ultrasonic humidifier can help congestion.  You can place a towel over your head and breathe in the steam from hot water  coming from a faucet. Avoid close contacts especially the very young and the elderly. Cover your mouth when you cough or sneeze. Always remember to wash your hands.  Get Help Right Away If: You develop worsening fever or sinus  pain. You develop a severe head ache or visual changes. Your symptoms persist after you have completed your treatment plan.  Make sure you Understand these instructions. Will watch your condition. Will get help right away if you are not doing well or get worse.  Your e-visit answers were reviewed by a board certified advanced clinical practitioner to complete your personal care plan.  Depending on the condition, your plan could have included both over the counter or prescription medications.  If there is a problem please reply  once you have received a response from your provider.  Your safety is important to us .  If you have drug allergies check your prescription carefully.    You can use MyChart to ask questions about todays visit, request a non-urgent call back, or ask for a work or school excuse for 24 hours related to this e-Visit. If it has been greater than 24 hours you will need to follow up with your provider, or enter a new e-Visit to address those concerns.  You will get an e-mail in the next two days asking about your experience.  I hope that your e-visit has been valuable and will speed your recovery. Thank you for using e-visits.  I have spent 5 minutes in review of e-visit questionnaire, review and updating patient chart, medical decision making and response to patient.   Delon CHRISTELLA Dickinson, PA-C     "

## 2024-02-19 NOTE — Addendum Note (Signed)
 Addended by: VIVIENNE FORTUNATO HERO on: 02/19/2024 10:56 AM   Modules accepted: Orders

## 2024-02-20 ENCOUNTER — Ambulatory Visit: Admitting: Internal Medicine

## 2024-02-28 ENCOUNTER — Telehealth: Payer: Self-pay | Admitting: Adult Health

## 2024-02-28 MED ORDER — SULFAMETHOXAZOLE-TRIMETHOPRIM 800-160 MG PO TABS: 1.0000 | ORAL_TABLET | Freq: Two times a day (BID) | ORAL | 0 refills | Status: AC

## 2024-02-28 NOTE — Telephone Encounter (Signed)
 Has UTI symptoms requests rx, will rx septra  ds,needs appt if not better
